# Patient Record
Sex: Male | Born: 1980 | Race: White | Hispanic: No | Marital: Single | Smoking: Never smoker
Health system: Southern US, Community
[De-identification: ages and names within clinical notes are randomized; demographics above are authoritative.]

## PROBLEM LIST (undated history)

## (undated) ENCOUNTER — Ambulatory Visit: Admission: EM | Payer: Self-pay | Source: Home / Self Care

## (undated) ENCOUNTER — Emergency Department (HOSPITAL_COMMUNITY): Admission: EM | Payer: PRIVATE HEALTH INSURANCE | Source: Home / Self Care

## (undated) DIAGNOSIS — J45909 Unspecified asthma, uncomplicated: Secondary | ICD-10-CM

## (undated) DIAGNOSIS — N289 Disorder of kidney and ureter, unspecified: Secondary | ICD-10-CM

## (undated) HISTORY — PX: CYSTOSCOPY W/ URETEROSCOPY W/ LITHOTRIPSY: SUR380

---

## 2005-04-25 ENCOUNTER — Emergency Department: Payer: Self-pay | Admitting: Emergency Medicine

## 2005-08-29 ENCOUNTER — Emergency Department: Payer: Self-pay | Admitting: Emergency Medicine

## 2008-02-29 ENCOUNTER — Emergency Department: Payer: Self-pay | Admitting: Internal Medicine

## 2008-04-18 ENCOUNTER — Ambulatory Visit: Payer: Self-pay | Admitting: Urology

## 2008-04-20 ENCOUNTER — Ambulatory Visit: Payer: Self-pay | Admitting: Urology

## 2008-04-26 ENCOUNTER — Ambulatory Visit: Payer: Self-pay | Admitting: Urology

## 2008-05-10 ENCOUNTER — Ambulatory Visit: Payer: Self-pay | Admitting: Urology

## 2010-05-14 IMAGING — CR DG ABDOMEN 1V
1 series · 1 of 1 positions shown · non-contrast
Comparison: none

REASON FOR EXAM: nephrolithiasis  pt need films
COMMENTS:

[view not recorded]
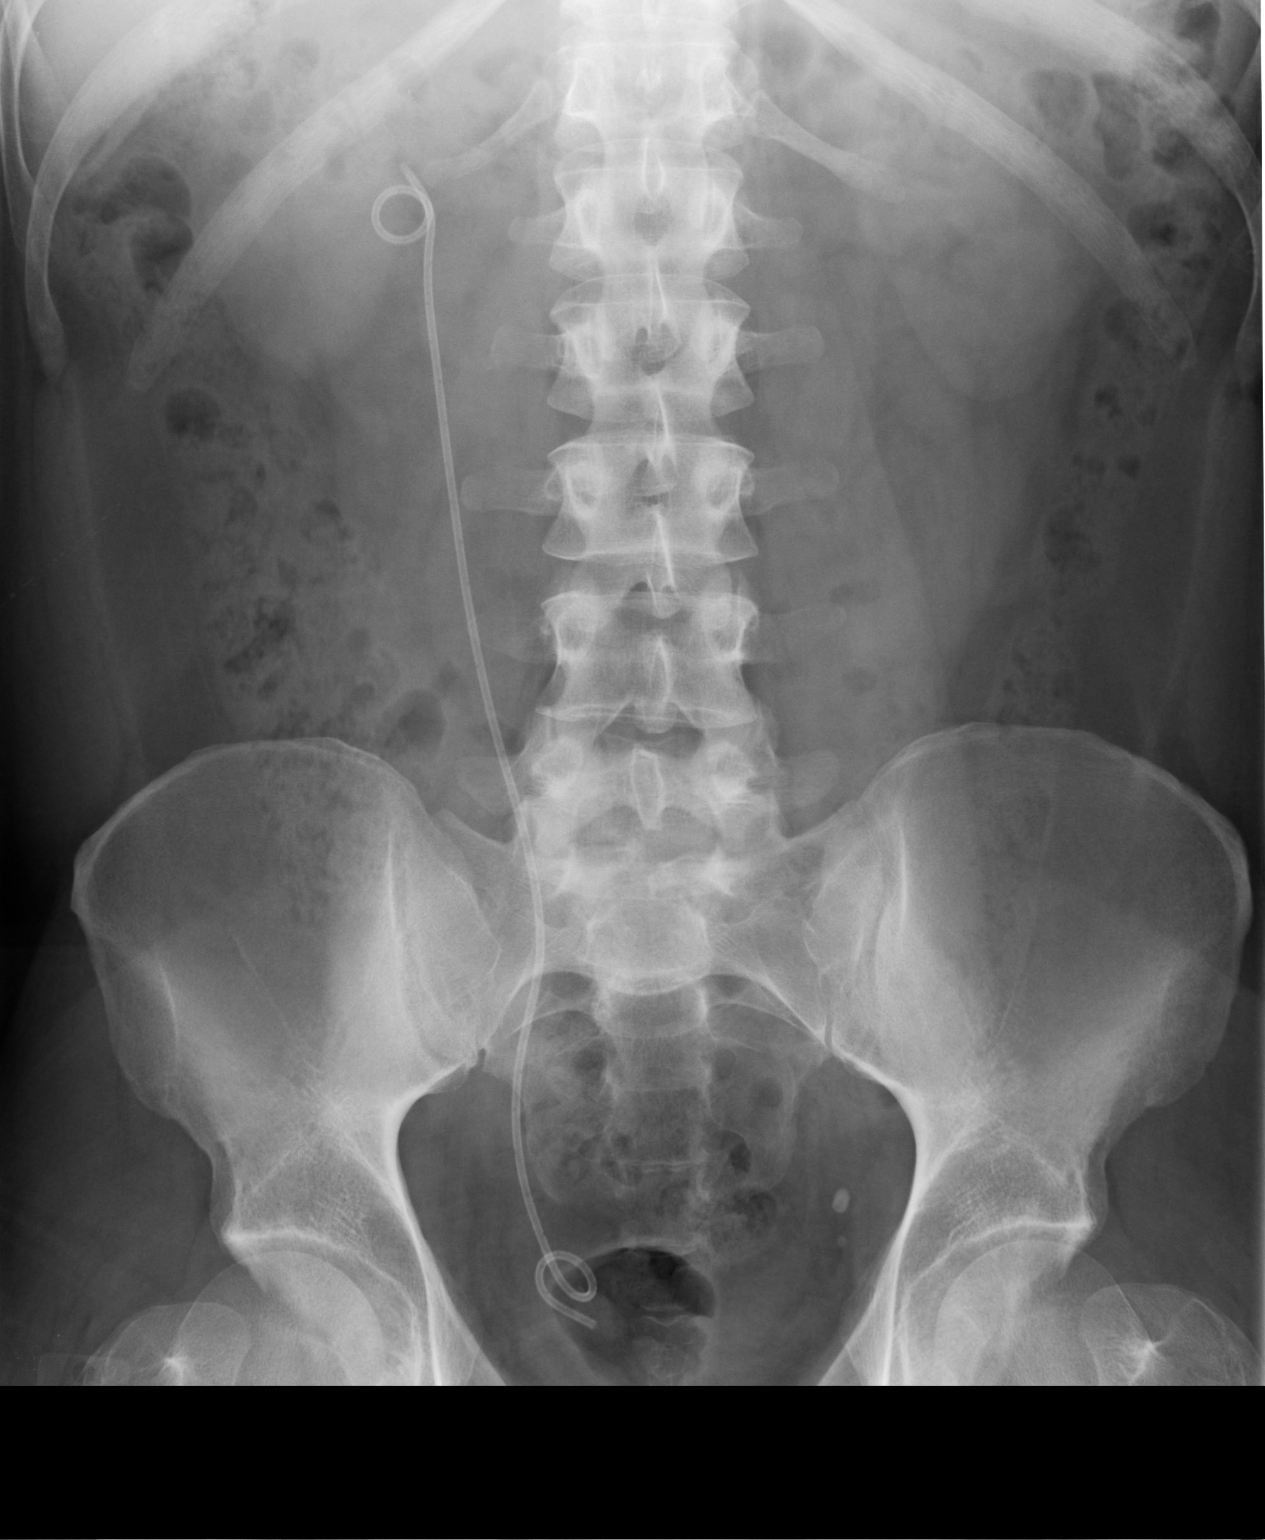

[1 of 1 positions shown; findings below may reference images not displayed]

PROCEDURE:     DXR - DXR KIDNEY URETER BLADDER  - April 26, 2008 [DATE]

RESULT:     Comparison is made to the prior exam of 04/18/2008. A RIGHT
ureteral stent is now present. The previously noted calcification low in the
RIGHT pelvis is no longer seen and apparently represented a stone that has
passed in the bladder. No definite ureteral stones are identified along the
course of the stent. No renal calcifications are identified.
IMPRESSION: 1.     Please see above.

## 2013-01-20 ENCOUNTER — Emergency Department (HOSPITAL_COMMUNITY)
Admission: EM | Admit: 2013-01-20 | Discharge: 2013-01-20 | Disposition: A | Discharge status: Home / Self Care | Payer: BC Managed Care – PPO | Attending: Emergency Medicine | Admitting: Emergency Medicine

## 2013-01-20 ENCOUNTER — Encounter (HOSPITAL_COMMUNITY): Payer: Self-pay | Admitting: *Deleted

## 2013-01-20 DIAGNOSIS — Y9302 Activity, running: Secondary | ICD-10-CM | POA: Insufficient documentation

## 2013-01-20 DIAGNOSIS — Y9289 Other specified places as the place of occurrence of the external cause: Secondary | ICD-10-CM | POA: Insufficient documentation

## 2013-01-20 DIAGNOSIS — Z87442 Personal history of urinary calculi: Secondary | ICD-10-CM | POA: Insufficient documentation

## 2013-01-20 DIAGNOSIS — S838X9A Sprain of other specified parts of unspecified knee, initial encounter: Secondary | ICD-10-CM | POA: Insufficient documentation

## 2013-01-20 DIAGNOSIS — Z79899 Other long term (current) drug therapy: Secondary | ICD-10-CM | POA: Insufficient documentation

## 2013-01-20 DIAGNOSIS — J45901 Unspecified asthma with (acute) exacerbation: Secondary | ICD-10-CM | POA: Insufficient documentation

## 2013-01-20 DIAGNOSIS — W2209XA Striking against other stationary object, initial encounter: Secondary | ICD-10-CM | POA: Insufficient documentation

## 2013-01-20 DIAGNOSIS — S86111A Strain of other muscle(s) and tendon(s) of posterior muscle group at lower leg level, right leg, initial encounter: Secondary | ICD-10-CM

## 2013-01-20 HISTORY — DX: Unspecified asthma, uncomplicated: J45.909

## 2013-01-20 HISTORY — DX: Disorder of kidney and ureter, unspecified: N28.9

## 2013-01-20 MED ORDER — CELECOXIB 100 MG PO CAPS: 100.0000 mg | ORAL_CAPSULE | Freq: Two times a day (BID) | ORAL | Status: DC

## 2013-01-20 NOTE — ED Notes (Signed)
While running 1st 5K obstacle course, went over a-frame wooden ramp, throwing R leg over the top, hitting fibula area on crossbeam.  Pain in lower extremity on mid-fibula region began that night.  Has now progressed to include mid-tibial region.  Constant ache, with tenderness to palpation from mid-tibia laterally to posterior calf.  States pain in more severe with inversion of foot while standing or walking and with plantar flexion, which radiates pain from ankle to lateral knee.  No pain on foot eversion or dorsi flexion.  No bruising or swelling noted.  Muscle soft to palpation.  No asymmetry of extremities noted.  Has taken ibuprofen once for pain, but has not needed subsequent doses.

## 2013-01-20 NOTE — Progress Notes (Signed)
ED/CM noted patient did not have health insurance and/or PCP listed in the computer.  Patient was given the [place] resource handout with information on the clinics, food pantries, and the handout for new health insurance sign-up.  Patient expressed appreciation for this. 

## 2013-01-20 NOTE — ED Notes (Signed)
Patient with no complaints at this time. Respirations even and unlabored. Skin warm/dry. Discharge instructions reviewed with patient at this time. Patient given opportunity to voice concerns/ask questions. Patient discharged at this time and left Emergency Department with steady gait.   

## 2013-01-20 NOTE — Care Management ED Note (Signed)
       CARE MANAGEMENT ED NOTE 01/20/2013  Patient:  Tyrone Ferguson,Tyrone Ferguson   Account Number:  [id number]  Date Initiated:  01/20/2013  Documentation initiated by:  Anibal Henderson  Subjective/Objective Assessment:     Subjective/Objective Assessment Detail:   ED/CM noted patient did not have health insurance and/or PCP listed in the computer.  Patient was given the [facility] with information on the clinics, food pantries, and the handout for new health insurance sign-up.  Patient expressed appreciation for this.     Action/Plan:   Action/Plan Detail:   Anticipated DC Date:  01/20/2013     Status Recommendation to Physician:   Result of Recommendation:        Choice offered to / List presented to:            Status of service:    ED Comments:   ED Comments Detail:

## 2013-01-20 NOTE — ED Provider Notes (Signed)
Medical screening examination/treatment/procedure(s) were performed by non-physician practitioner and as supervising physician I was immediately available for consultation/collaboration.   Candyce Churn, MD 01/20/13 [phone]

## 2013-01-20 NOTE — ED Provider Notes (Signed)
CSN: [id number]     Arrival date & time 01/20/13  0912 History   First MD Initiated Contact with Patient 01/20/13 0930     Chief Complaint  Patient presents with  . Leg Injury   (Consider location/radiation/quality/duration/timing/severity/associated sxs/prior Treatment) HPI Comments: Pt was running in a 5K obstacle course Sat. 9/13 and injured the right upper leg early in the run. By the time he finished the course he states he was limping. He has tried tylenol but has not rested the knee and leg. No hx of previous operations or procedures on the right lower extremity. No other injury.  The history is provided by the patient.    Past Medical History  Diagnosis Date  . Renal disorder     kidney stones  . Asthma    Past Surgical History  Procedure Laterality Date  . Cystoscopy w/ ureteroscopy w/ lithotripsy     No family history on file. History  Substance Use Topics  . Smoking status: Never Smoker   . Smokeless tobacco: Never Used  . Alcohol Use: No    Review of Systems  Constitutional: Negative for activity change.       All ROS Neg except as noted in HPI  HENT: Negative for nosebleeds and neck pain.   Eyes: Negative for photophobia and discharge.  Respiratory: Positive for wheezing. Negative for cough and shortness of breath.   Cardiovascular: Negative for chest pain and palpitations.  Gastrointestinal: Negative for abdominal pain and blood in stool.  Genitourinary: Negative for dysuria, frequency and hematuria.  Musculoskeletal: Negative for back pain and arthralgias.  Skin: Negative.   Neurological: Negative for dizziness, seizures and speech difficulty.  Psychiatric/Behavioral: Negative for hallucinations and confusion.    Allergies  Aspirin and Morphine and related  Home Medications   Current Outpatient Rx  Name  Route  Sig  Dispense  Refill  . acetaminophen (TYLENOL) 500 MG tablet   Oral   Take 1,000 mg by mouth every 6 (six) hours as needed for pain.        Marland Kitchen albuterol (PROVENTIL HFA;VENTOLIN HFA) 108 (90 BASE) MCG/ACT inhaler   Inhalation   Inhale 2 puffs into the lungs every 6 (six) hours as needed for wheezing.          BP 111/78  Pulse 71  Temp(Src) 98.1 F (36.7 C) (Oral)  Resp 16  Ht 5\' 7"  (1.702 m)  Wt 185 lb (83.915 kg)  BMI 28.97 kg/m2  SpO2 97% Physical Exam  Nursing note and vitals reviewed. Constitutional: He is oriented to person, place, and time. He appears well-developed and well-nourished.  Non-toxic appearance.  HENT:  Head: Normocephalic.  Right Ear: Tympanic membrane and external ear normal.  Left Ear: Tympanic membrane and external ear normal.  Eyes: EOM and lids are normal. Pupils are equal, round, and reactive to light.  Neck: Normal range of motion. Neck supple. Carotid bruit is not present.  Cardiovascular: Normal rate, regular rhythm, normal heart sounds, intact distal pulses and normal pulses.   Pulmonary/Chest: Breath sounds normal. No respiratory distress.  Abdominal: Soft. Bowel sounds are normal. There is no tenderness. There is no guarding.  Musculoskeletal: Normal range of motion.       Legs: FROM of the right hip. No effusion or deformity or laxity of the right knee. No bruise of the right calf. No hematoma of the right calf or lower let. Right and left lower leg are the same size and without redness.  Lymphadenopathy:  Head (right side): No submandibular adenopathy present.       Head (left side): No submandibular adenopathy present.    He has no cervical adenopathy.  Neurological: He is alert and oriented to person, place, and time. He has normal strength. No cranial nerve deficit or sensory deficit.  Skin: Skin is warm and dry.  Psychiatric: He has a normal mood and affect. His speech is normal.    ED Course  Procedures (including critical care time) Labs Review Labs Reviewed - No data to display Imaging Review No results found.  MDM  No diagnosis found. *I have reviewed  nursing notes, vital signs, and all appropriate lab and imaging results for this patient.**  Suspect gastroc strain during obstacle course. Pt will be fitted with knee immobilizer and Rx for mobic ordered. Pt to see Dr Romeo Apple for orthopedic evaluation if not improving.  Kathie Dike, PA-C 01/20/13 1100

## 2013-01-20 NOTE — ED Notes (Signed)
Pt states he was running a 5K obstacle course and hit his right lower leg on an obstacle. Pain to right lower leg since.

## 2021-06-23 ENCOUNTER — Other Ambulatory Visit: Payer: Self-pay

## 2021-06-23 ENCOUNTER — Emergency Department (HOSPITAL_COMMUNITY): Payer: 59

## 2021-06-23 ENCOUNTER — Emergency Department (HOSPITAL_COMMUNITY)
Admission: EM | Admit: 2021-06-23 | Discharge: 2021-06-23 | Disposition: A | Discharge status: Home / Self Care | Payer: 59 | Attending: Emergency Medicine | Admitting: Emergency Medicine

## 2021-06-23 ENCOUNTER — Encounter (HOSPITAL_COMMUNITY): Payer: Self-pay

## 2021-06-23 DIAGNOSIS — N201 Calculus of ureter: Secondary | ICD-10-CM | POA: Diagnosis not present

## 2021-06-23 DIAGNOSIS — R109 Unspecified abdominal pain: Secondary | ICD-10-CM | POA: Diagnosis present

## 2021-06-23 LAB — CBC
HCT: 41.2 % (ref 39.0–52.0)
Hemoglobin: 13.6 g/dL (ref 13.0–17.0)
MCH: 31.6 pg (ref 26.0–34.0)
MCHC: 33 g/dL (ref 30.0–36.0)
MCV: 95.8 fL (ref 80.0–100.0)
Platelets: 364 10*3/uL (ref 150–400)
RBC: 4.3 MIL/uL (ref 4.22–5.81)
RDW: 13.2 % (ref 11.5–15.5)
WBC: 12.3 10*3/uL — ABNORMAL HIGH (ref 4.0–10.5)
nRBC: 0 % (ref 0.0–0.2)

## 2021-06-23 LAB — URINALYSIS, ROUTINE W REFLEX MICROSCOPIC
Bacteria, UA: NONE SEEN
Bilirubin Urine: NEGATIVE
Glucose, UA: NEGATIVE mg/dL
Ketones, ur: NEGATIVE mg/dL
Leukocytes,Ua: NEGATIVE
Nitrite: POSITIVE — AB
Protein, ur: 30 mg/dL — AB
RBC / HPF: >50 RBC/hpf — ABNORMAL HIGH (ref 0–5)
Specific Gravity, Urine: 1.023 (ref 1.005–1.030)
pH: 7 (ref 5.0–8.0)

## 2021-06-23 LAB — COMPREHENSIVE METABOLIC PANEL
ALT: 19 U/L (ref 0–44)
AST: 19 U/L (ref 15–41)
Albumin: 4 g/dL (ref 3.5–5.0)
Alkaline Phosphatase: 65 U/L (ref 38–126)
Anion gap: 7 (ref 5–15)
BUN: 17 mg/dL (ref 6–20)
CO2: 28 mmol/L (ref 22–32)
Calcium: 9.2 mg/dL (ref 8.9–10.3)
Chloride: 102 mmol/L (ref 98–111)
Creatinine, Ser: 1.35 mg/dL — ABNORMAL HIGH (ref 0.61–1.24)
GFR, Estimated: >60 mL/min (ref >60)
Glucose, Bld: 112 mg/dL — ABNORMAL HIGH (ref 70–99)
Potassium: 3.9 mmol/L (ref 3.5–5.1)
Sodium: 137 mmol/L (ref 135–145)
Total Bilirubin: 0.4 mg/dL (ref 0.3–1.2)
Total Protein: 7.4 g/dL (ref 6.5–8.1)

## 2021-06-23 LAB — LIPASE, BLOOD: Lipase: 32 U/L (ref 11–51)

## 2021-06-23 MED ORDER — HYDROMORPHONE HCL 1 MG/ML IJ SOLN
0.5000 mg | Freq: Once | INTRAMUSCULAR | Status: AC
  Administered 2021-06-23: 0.5 mg via INTRAVENOUS
  Filled 2021-06-23: qty 1

## 2021-06-23 MED ORDER — OXYCODONE-ACETAMINOPHEN 5-325 MG PO TABS: 1.0000 | ORAL_TABLET | ORAL | 0 refills | Status: DC | PRN

## 2021-06-23 MED ORDER — TAMSULOSIN HCL 0.4 MG PO CAPS
0.4000 mg | ORAL_CAPSULE | Freq: Once | ORAL | Status: AC
  Administered 2021-06-23: 0.4 mg via ORAL
  Filled 2021-06-23: qty 1

## 2021-06-23 MED ORDER — OXYCODONE-ACETAMINOPHEN 5-325 MG PO TABS: 1.0000 | ORAL_TABLET | Freq: Four times a day (QID) | ORAL | 0 refills | Status: DC | PRN

## 2021-06-23 MED ORDER — FENTANYL CITRATE PF 50 MCG/ML IJ SOSY
50.0000 ug | PREFILLED_SYRINGE | Freq: Once | INTRAMUSCULAR | Status: AC
  Administered 2021-06-23: 50 ug via INTRAVENOUS
  Filled 2021-06-23: qty 1

## 2021-06-23 MED ORDER — SODIUM CHLORIDE 0.9 % IV BOLUS
1000.0000 mL | Freq: Once | INTRAVENOUS | Status: AC
Start: 2021-06-23 — End: 2021-06-23
  Administered 2021-06-23: 1000 mL via INTRAVENOUS

## 2021-06-23 MED ORDER — KETOROLAC TROMETHAMINE 30 MG/ML IJ SOLN
30.0000 mg | Freq: Once | INTRAMUSCULAR | Status: AC
  Administered 2021-06-23: 30 mg via INTRAVENOUS
  Filled 2021-06-23: qty 1

## 2021-06-23 MED ORDER — CEPHALEXIN 500 MG PO CAPS: 500.0000 mg | ORAL_CAPSULE | Freq: Four times a day (QID) | ORAL | 0 refills | Status: DC

## 2021-06-23 MED ORDER — TAMSULOSIN HCL 0.4 MG PO CAPS: 0.4000 mg | ORAL_CAPSULE | Freq: Every day | ORAL | 0 refills | Status: DC

## 2021-06-23 MED ORDER — ONDANSETRON HCL 4 MG/2ML IJ SOLN
4.0000 mg | Freq: Once | INTRAMUSCULAR | Status: AC
  Administered 2021-06-23: 4 mg via INTRAVENOUS
  Filled 2021-06-23: qty 2

## 2021-06-23 MED ORDER — CEPHALEXIN 500 MG PO CAPS
500.0000 mg | ORAL_CAPSULE | Freq: Once | ORAL | Status: AC
  Administered 2021-06-23: 500 mg via ORAL
  Filled 2021-06-23: qty 1

## 2021-06-23 NOTE — Discharge Instructions (Addendum)
Make sure you are drinking plenty of fluids.  Take your next dose of Keflex tomorrow morning and your next dose of the Flomax tomorrow evening.  Do not drive within 4 hours of taking oxycodone as this medication will make you drowsy.  Plan to call Dr. Kennyth Lose urology for close follow-up with them for this stone.  Return here immediately if you develop fever, uncontrolled vomiting or pain that is not being adequately controlled with the pain medication prescribed.

## 2021-06-23 NOTE — ED Triage Notes (Signed)
Patient complaining of left flank pain with radiation to left abdomen. Pain worse this am. Patient has been treating UTI symptoms with OTC meds for the past week.

## 2021-06-23 NOTE — ED Provider Notes (Signed)
[facility] EMERGENCY DEPARTMENT Provider Note   CSN: [id number] Arrival date & time: 06/23/21  1637     History  Chief Complaint  Patient presents with   Flank Pain    Tyrone Ferguson is a 41 y.o. male with a history of kidney stones presenting with a 1 week history of left-sided flank pain which radiates into his left abdomen.  He reports intermittent symptoms since this started, reports was traveling with his job and was unable to get definitive treatment but was able to tolerate it by use of OTC Azo.  He denies fevers or chills.  He has had escalating pain today which has been constant and increasingly severe associated with nausea and vomiting which also started when the pain became severe.  He reports his urine has been dark but also has been using Azo.  The history is provided by the patient.      Home Medications Prior to Admission medications   Medication Sig Start Date End Date Taking? Authorizing Provider  cephALEXin (KEFLEX) 500 MG capsule Take 1 capsule (500 mg total) by mouth 4 (four) times daily. 06/23/21  Yes Keedan Sample, Almyra Free, PA-C  oxyCODONE-acetaminophen (PERCOCET/ROXICET) 5-325 MG tablet Take 1 tablet by mouth every 4 (four) hours as needed. 06/23/21  Yes Brittinee Risk, Almyra Free, PA-C  oxyCODONE-acetaminophen (PERCOCET/ROXICET) 5-325 MG tablet Take 1 tablet by mouth every 6 (six) hours as needed for severe pain. 06/23/21  Yes Orlandus Borowski, Almyra Free, PA-C  tamsulosin (FLOMAX) 0.4 MG CAPS capsule Take 1 capsule (0.4 mg total) by mouth daily after supper. 06/23/21  Yes Cherryl Babin, Almyra Free, PA-C  acetaminophen (TYLENOL) 500 MG tablet Take 1,000 mg by mouth every 6 (six) hours as needed for pain.    [provider]  albuterol (PROVENTIL HFA;VENTOLIN HFA) 108 (90 BASE) MCG/ACT inhaler Inhale 2 puffs into the lungs every 6 (six) hours as needed for wheezing.    [provider]  celecoxib (CELEBREX) 100 MG capsule Take 1 capsule (100 mg total) by mouth 2 (two) times daily. 01/20/13   Lily Kocher,  PA-C      Allergies    Aspirin and Morphine and related    Review of Systems   Review of Systems  Constitutional:  Negative for fever.  HENT:  Negative for congestion and sore throat.   Eyes: Negative.   Respiratory:  Negative for chest tightness and shortness of breath.   Cardiovascular:  Negative for chest pain.  Gastrointestinal:  Positive for nausea and vomiting. Negative for abdominal pain.  Genitourinary:  Positive for decreased urine volume, flank pain and frequency. Negative for dysuria.  Musculoskeletal:  Negative for arthralgias, joint swelling and neck pain.  Skin: Negative.  Negative for rash and wound.  Neurological:  Negative for dizziness, weakness, light-headedness, numbness and headaches.  Psychiatric/Behavioral: Negative.     Physical Exam Updated Vital Signs BP 108/70    Pulse 60    Temp 98 F (36.7 C)    Resp 17    Ht 5\' 7"  (1.702 m)    Wt 88.5 kg    SpO2 (!) 87%    BMI 30.54 kg/m  Physical Exam Vitals and nursing note reviewed.  Constitutional:      Appearance: He is well-developed.  HENT:     Head: Normocephalic and atraumatic.  Eyes:     Conjunctiva/sclera: Conjunctivae normal.  Cardiovascular:     Rate and Rhythm: Normal rate and regular rhythm.     Heart sounds: Normal heart sounds.  Pulmonary:     Effort: Pulmonary  effort is normal.     Breath sounds: Normal breath sounds. No wheezing.  Abdominal:     General: Bowel sounds are normal.     Palpations: Abdomen is soft.     Tenderness: There is no abdominal tenderness. There is left CVA tenderness. There is no guarding or rebound.  Musculoskeletal:        General: Normal range of motion.     Cervical back: Normal range of motion.  Skin:    General: Skin is warm and dry.  Neurological:     Mental Status: He is alert.    ED Results / Procedures / Treatments   Labs (all labs ordered are listed, but only abnormal results are displayed) Labs Reviewed  COMPREHENSIVE METABOLIC PANEL - Abnormal;  Notable for the following components:      Result Value   Glucose, Bld 112 (*)    Creatinine, Ser 1.35 (*)    All other components within normal limits  CBC - Abnormal; Notable for the following components:   WBC 12.3 (*)    All other components within normal limits  URINALYSIS, ROUTINE W REFLEX MICROSCOPIC - Abnormal; Notable for the following components:   Color, Urine AMBER (*)    APPearance CLOUDY (*)    Hgb urine dipstick MODERATE (*)    Protein, ur 30 (*)    Nitrite POSITIVE (*)    RBC / HPF >50 (*)    All other components within normal limits  LIPASE, BLOOD    EKG None  Radiology CT Renal Stone Study  Result Date: 06/23/2021 CLINICAL DATA:  Left flank pain. EXAM: CT ABDOMEN AND PELVIS WITHOUT CONTRAST TECHNIQUE: Multidetector CT imaging of the abdomen and pelvis was performed following the standard protocol without IV contrast. RADIATION DOSE REDUCTION: This exam was performed according to the departmental dose-optimization program which includes automated exposure control, adjustment of the mA and/or kV according to patient size and/or use of iterative reconstruction technique. COMPARISON:  Remote abdominal CT 08/29/2005 FINDINGS: Lower chest: 3 mm right lower lobe subpleural nodule, series 4, image 4. No consolidation or pleural effusion. The heart is normal in size. Hepatobiliary: No focal liver abnormality is seen. No gallstones, gallbladder wall thickening, or biliary dilatation. Pancreas: No ductal dilatation or inflammation. Spleen: Normal in size without focal abnormality. Adrenals/Urinary Tract: Normal adrenal glands. Obstructing elongated stone versus 2 adjacent stones at the left ureterovesicular junction measuring 8 mm with moderate proximal hydroureteronephrosis. Mild left perinephric edema. There are at least 3 punctate additional nonobstructing stones in the mid lower left kidney. Probable cortical cyst in the posterior mid left kidney. There is no right hydronephrosis.  Two right renal calculi, largest in the lower pole measuring 4 mm. Urinary bladder is only minimally distended. No bladder wall thickening. Stomach/Bowel: Small hiatal hernia. The stomach is otherwise unremarkable. No bowel obstruction or inflammation. Normal appendix. Small to moderate colonic stool burden. Occasional colonic diverticula in the distal descending and sigmoid colon. No diverticulitis. Vascular/Lymphatic: Normal caliber abdominal aorta with mild atherosclerosis. No enlarged lymph nodes in the abdomen or pelvis. Reproductive: Prostate is unremarkable. Other: No ascites or free air. Small fat containing umbilical hernia. Musculoskeletal: There are no acute or suspicious osseous abnormalities. IMPRESSION: 1. Obstructing elongated stone versus 2 adjacent stones at the left ureterovesicular junction measuring 8 mm with moderate proximal hydroureteronephrosis. 2. Additional nonobstructing stones in both kidneys. 3. Minimal colonic diverticulosis without diverticulitis. 4. Small hiatal hernia. 5. A 3 mm right lower lobe subpleural nodule. No follow-up needed if  patient is low-risk. Non-contrast chest CT can be considered in 12 months if patient is high-risk. This recommendation follows the consensus statement: Guidelines for Management of Incidental Pulmonary Nodules Detected on CT Images: From the Fleischner Society 2017; Radiology 2017; 284:228-243. 6. Mild aortic and iliac atherosclerosis. Aortic Atherosclerosis (ICD10-I70.0). Electronically Signed   By: Keith Rake M.D.   On: 06/23/2021 18:18    Procedures Procedures    Medications Ordered in ED Medications  ondansetron (ZOFRAN) injection 4 mg (4 mg Intravenous Given 06/23/21 1749)  fentaNYL (SUBLIMAZE) injection 50 mcg (50 mcg Intravenous Given 06/23/21 1749)  sodium chloride 0.9 % bolus 1,000 mL (0 mLs Intravenous Stopped 06/23/21 2153)  ketorolac (TORADOL) 30 MG/ML injection 30 mg (30 mg Intravenous Given 06/23/21 2054)  HYDROmorphone  (DILAUDID) injection 0.5 mg (0.5 mg Intravenous Given 06/23/21 2055)  ondansetron (ZOFRAN) injection 4 mg (4 mg Intravenous Given 06/23/21 2053)  cephALEXin (KEFLEX) capsule 500 mg (500 mg Oral Given 06/23/21 2225)  tamsulosin (FLOMAX) capsule 0.4 mg (0.4 mg Oral Given 06/23/21 2225)    ED Course/ Medical Decision Making/ A&P                           Medical Decision Making Pt with left flank pain, n/v, afebrile, h/o kidney stones with todays presentation similar to prior kidney stone.  He was given IVF here along with zofran and fentanyl which offered relief transiently, After imaging obtained,  added small dose of dilaudid and also toradol which resolved his pain sx.    Amount and/or Complexity of Data Reviewed Labs: ordered.    Details: elevated wbc count at 12.3, but afebrile.  creatinine 1.35, no old creatinine to compare, urine negative for infection, + hgb. Radiology: ordered.    Details: Imaging reviewed and discussed with pt.  He is low risk for lung ca - no smoking hx. Discussion of management or test interpretation with external provider(s): Discussed pts sx, lab results and Ct imaging results with Dr. Junious Silk.  He would like pt to have close outpt f/u with him, would recommend keflex , flomax and pain med in the interim.  Meds started here.   Urine strainer also provided pt.  Return precautions discussed.  Pt understands and agreeable with plan.   Risk Prescription drug management. Decision regarding hospitalization.           Final Clinical Impression(s) / ED Diagnoses Final diagnoses:  Ureterolithiasis    Rx / DC Orders ED Discharge Orders          Ordered    cephALEXin (KEFLEX) 500 MG capsule  4 times daily        06/23/21 2204    tamsulosin (FLOMAX) 0.4 MG CAPS capsule  Daily after supper        06/23/21 2204    oxyCODONE-acetaminophen (PERCOCET/ROXICET) 5-325 MG tablet  Every 4 hours PRN        06/23/21 2204    oxyCODONE-acetaminophen (PERCOCET/ROXICET)  5-325 MG tablet  Every 6 hours PRN        06/23/21 2206              Evalee Jefferson, Hershal Coria 06/24/21 1602    Milton Ferguson, MD 06/25/21 1550

## 2021-06-24 MED FILL — Oxycodone w/ Acetaminophen Tab 5-325 MG: ORAL | Qty: 6 | Status: AC

## 2021-06-26 ENCOUNTER — Other Ambulatory Visit: Payer: Self-pay

## 2021-06-26 DIAGNOSIS — N200 Calculus of kidney: Secondary | ICD-10-CM

## 2021-07-01 ENCOUNTER — Ambulatory Visit (INDEPENDENT_AMBULATORY_CARE_PROVIDER_SITE_OTHER): Payer: 59 | Admitting: Urology

## 2021-07-01 ENCOUNTER — Encounter: Payer: Self-pay | Admitting: Urology

## 2021-07-01 ENCOUNTER — Ambulatory Visit (HOSPITAL_COMMUNITY)
Admission: RE | Admit: 2021-07-01 | Discharge: 2021-07-01 | Disposition: A | Discharge status: Home / Self Care | Payer: 59 | Source: Ambulatory Visit | Attending: Urology | Admitting: Urology

## 2021-07-01 ENCOUNTER — Other Ambulatory Visit: Payer: Self-pay

## 2021-07-01 VITALS — BP 106/69 | HR 81 | Ht 67.0 in | Wt 195.0 lb

## 2021-07-01 DIAGNOSIS — N200 Calculus of kidney: Secondary | ICD-10-CM | POA: Insufficient documentation

## 2021-07-01 LAB — URINALYSIS, ROUTINE W REFLEX MICROSCOPIC
Bilirubin, UA: NEGATIVE
Glucose, UA: NEGATIVE
Ketones, UA: NEGATIVE
Leukocytes,UA: NEGATIVE
Nitrite, UA: NEGATIVE
Protein,UA: NEGATIVE
Specific Gravity, UA: 1.025 (ref 1.005–1.030)
Urobilinogen, Ur: 0.2 mg/dL (ref 0.2–1.0)
pH, UA: 6.5 (ref 5.0–7.5)

## 2021-07-01 LAB — MICROSCOPIC EXAMINATION
Renal Epithel, UA: NONE SEEN /hpf
WBC, UA: NONE SEEN /hpf (ref 0–5)

## 2021-07-01 NOTE — Progress Notes (Signed)
07/01/2021 12:11 PM   Tyrone Ferguson Jul 16, 1980 [id number]  Referring provider: No referring provider defined for this encounter.  Nephrolithiasis   HPI: [name] is a 41yo here for evaluation of nephrolithiasis. He was seen in the ER 2/19 with severe left flank pain and was diagnosed with left ureteral calculi measuring 59mm. He passed his calculi 2 days ago and brought them with him today. His left flank pain has resolved. He has had numerous stone events in the past 15 years. He was previously a patient of [facility]. He has not had a metabolic evaluation for his hx of nephrolithiasis. He denies any other associated symptoms. KUB from today shows bilateral renal calculi. He denies any flank pain currently.    PMH: Past Medical History:  Diagnosis Date   Asthma    Renal disorder    kidney stones    Surgical History: Past Surgical History:  Procedure Laterality Date   CYSTOSCOPY W/ URETEROSCOPY W/ LITHOTRIPSY      Home Medications:  Allergies as of 07/01/2021       Reactions   Aspirin Nausea And Vomiting   Morphine And Related Nausea And Vomiting        Medication List        Accurate as of July 01, 2021 12:11 PM. If you have any questions, ask your nurse or doctor.          acetaminophen 500 MG tablet Commonly known as: TYLENOL Take 1,000 mg by mouth every 6 (six) hours as needed for pain.   albuterol 108 (90 Base) MCG/ACT inhaler Commonly known as: VENTOLIN HFA Inhale 2 puffs into the lungs every 6 (six) hours as needed for wheezing.   celecoxib 100 MG capsule Commonly known as: CeleBREX Take 1 capsule (100 mg total) by mouth 2 (two) times daily.   cephALEXin 500 MG capsule Commonly known as: KEFLEX Take 1 capsule (500 mg total) by mouth 4 (four) times daily.   oxyCODONE-acetaminophen 5-325 MG tablet Commonly known as: PERCOCET/ROXICET Take 1 tablet by mouth every 4 (four) hours as needed.   oxyCODONE-acetaminophen 5-325 MG  tablet Commonly known as: PERCOCET/ROXICET Take 1 tablet by mouth every 6 (six) hours as needed for severe pain.   tamsulosin 0.4 MG Caps capsule Commonly known as: Flomax Take 1 capsule (0.4 mg total) by mouth daily after supper.        Allergies:  Allergies  Allergen Reactions   Aspirin Nausea And Vomiting   Morphine And Related Nausea And Vomiting    Family History: History reviewed. No pertinent family history.  Social History:  reports that he has never smoked. He has never used smokeless tobacco. He reports that he does not drink alcohol. No history on file for drug use.  ROS: All other review of systems were reviewed and are negative except what is noted above in HPI  Physical Exam: BP 106/69    Pulse 81    Ht 5\' 7"  (1.702 m)    Wt 195 lb (88.5 kg)    BMI 30.54 kg/m   Constitutional:  Alert and oriented, No acute distress. HEENT: [place] AT, moist mucus membranes.  Trachea midline, no masses. Cardiovascular: No clubbing, cyanosis, or edema. Respiratory: Normal respiratory effort, no increased work of breathing. GI: Abdomen is soft, nontender, nondistended, no abdominal masses GU: No CVA tenderness.  Lymph: No cervical or inguinal lymphadenopathy. Skin: No rashes, bruises or suspicious lesions. Neurologic: Grossly intact, no focal deficits, moving all 4 extremities. Psychiatric: Normal mood and  affect.  Laboratory Data: Lab Results  Component Value Date   WBC 12.3 (H) 06/23/2021   HGB 13.6 06/23/2021   HCT 41.2 06/23/2021   MCV 95.8 06/23/2021   PLT 364 06/23/2021    Lab Results  Component Value Date   CREATININE 1.35 (H) 06/23/2021    No results found for: PSA  No results found for: TESTOSTERONE  No results found for: HGBA1C  Urinalysis    Component Value Date/Time   COLORURINE AMBER (A) 06/23/2021 2007   APPEARANCEUR CLOUDY (A) 06/23/2021 2007   LABSPEC 1.023 06/23/2021 2007   PHURINE 7.0 06/23/2021 2007   GLUCOSEU NEGATIVE 06/23/2021 2007    HGBUR MODERATE (A) 06/23/2021 2007   [place] NEGATIVE 06/23/2021 2007   [place] NEGATIVE 06/23/2021 2007   PROTEINUR 30 (A) 06/23/2021 2007   NITRITE POSITIVE (A) 06/23/2021 2007   LEUKOCYTESUR NEGATIVE 06/23/2021 2007    Lab Results  Component Value Date   BACTERIA NONE SEEN 06/23/2021    Pertinent Imaging: CT stone study 2/19 and KUb today: Images reviewed and discussed with the patient No results found for this or any previous visit.  No results found for this or any previous visit.  No results found for this or any previous visit.  No results found for this or any previous visit.  No results found for this or any previous visit.  No results found for this or any previous visit.  No results found for this or any previous visit.  Results for orders placed during the hospital encounter of 06/23/21  CT Renal Stone Study  Narrative CLINICAL DATA:  Left flank pain.  EXAM: CT ABDOMEN AND PELVIS WITHOUT CONTRAST  TECHNIQUE: Multidetector CT imaging of the abdomen and pelvis was performed following the standard protocol without IV contrast.  RADIATION DOSE REDUCTION: This exam was performed according to the departmental dose-optimization program which includes automated exposure control, adjustment of the mA and/or kV according to patient size and/or use of iterative reconstruction technique.  COMPARISON:  Remote abdominal CT 08/29/2005  FINDINGS: Lower chest: 3 mm right lower lobe subpleural nodule, series 4, image 4. No consolidation or pleural effusion. The heart is normal in size.  Hepatobiliary: No focal liver abnormality is seen. No gallstones, gallbladder wall thickening, or biliary dilatation.  Pancreas: No ductal dilatation or inflammation.  Spleen: Normal in size without focal abnormality.  Adrenals/Urinary Tract: Normal adrenal glands. Obstructing elongated stone versus 2 adjacent stones at the left ureterovesicular junction measuring 8 mm  with moderate proximal hydroureteronephrosis. Mild left perinephric edema. There are at least 3 punctate additional nonobstructing stones in the mid lower left kidney. Probable cortical cyst in the posterior mid left kidney. There is no right hydronephrosis. Two right renal calculi, largest in the lower pole measuring 4 mm. Urinary bladder is only minimally distended. No bladder wall thickening.  Stomach/Bowel: Small hiatal hernia. The stomach is otherwise unremarkable. No bowel obstruction or inflammation. Normal appendix. Small to moderate colonic stool burden. Occasional colonic diverticula in the distal descending and sigmoid colon. No diverticulitis.  Vascular/Lymphatic: Normal caliber abdominal aorta with mild atherosclerosis. No enlarged lymph nodes in the abdomen or pelvis.  Reproductive: Prostate is unremarkable.  Other: No ascites or free air. Small fat containing umbilical hernia.  Musculoskeletal: There are no acute or suspicious osseous abnormalities.  IMPRESSION: 1. Obstructing elongated stone versus 2 adjacent stones at the left ureterovesicular junction measuring 8 mm with moderate proximal hydroureteronephrosis. 2. Additional nonobstructing stones in both kidneys. 3. Minimal colonic diverticulosis without diverticulitis.  4. Small hiatal hernia. 5. A 3 mm right lower lobe subpleural nodule. No follow-up needed if patient is low-risk. Non-contrast chest CT can be considered in 12 months if patient is high-risk. This recommendation follows the consensus statement: Guidelines for Management of Incidental Pulmonary Nodules Detected on CT Images: From the Fleischner Society 2017; Radiology 2017; 284:228-243. 6. Mild aortic and iliac atherosclerosis.  Aortic Atherosclerosis (ICD10-I70.0).   Electronically Signed By: Keith Rake M.D. On: 06/23/2021 18:18   Assessment & Plan:    1. Kidney stones -BMP, uric acid, iPTH. 24 hour urine - Urinalysis,  Routine w reflex microscopic   No follow-ups on file.  Nicolette Bang, MD  [facility] [place]

## 2021-07-01 NOTE — Patient Instructions (Signed)
Dietary Guidelines to Help Prevent Kidney Stones Kidney stones are deposits of minerals and salts that form inside your kidneys. Your risk of developing kidney stones may be greater depending on your diet, your lifestyle, the medicines you take, and whether you have certain medical conditions. Most people can lower their chances of developing kidney stones by following the instructions below. Your dietitian may give you more specific instructions depending on your overall health and the type of kidney stones you tend to develop. What are tips for following this plan? Reading food labels  Choose foods with "no salt added" or "low-salt" labels. Limit your salt (sodium) intake to less than 1,500 mg a day. Choose foods with calcium for each meal and snack. Try to eat about 300 mg of calcium at each meal. Foods that contain 200-500 mg of calcium a serving include: 8 oz (237 mL) of milk, calcium-fortifiednon-dairy milk, and calcium-fortifiedfruit juice. Calcium-fortified means that calcium has been added to these drinks. 8 oz (237 mL) of kefir, yogurt, and soy yogurt. 4 oz (114 g) of tofu. 1 oz (28 g) of cheese. 1 cup (150 g) of dried figs. 1 cup (91 g) of cooked broccoli. One 3 oz (85 g) can of sardines or mackerel. Most people need 1,000-1,500 mg of calcium a day. Talk to your dietitian about how much calcium is recommended for you. Shopping Buy plenty of fresh fruits and vegetables. Most people do not need to avoid fruits and vegetables, even if these foods contain nutrients that may contribute to kidney stones. When shopping for convenience foods, choose: Whole pieces of fruit. Pre-made salads with dressing on the side. Low-fat fruit and yogurt smoothies. Avoid buying frozen meals or prepared deli foods. These can be high in sodium. Look for foods with live cultures, such as yogurt and kefir. Choose high-fiber grains, such as whole-wheat breads, oat bran, and wheat cereals. Cooking Do not add  salt to food when cooking. Place a salt shaker on the table and allow each person to add his or her own salt to taste. Use vegetable protein, such as beans, textured vegetable protein (TVP), or tofu, instead of meat in pasta, casseroles, and soups. Meal planning Eat less salt, if told by your dietitian. To do this: Avoid eating processed or pre-made food. Avoid eating fast food. Eat less animal protein, including cheese, meat, poultry, or fish, if told by your dietitian. To do this: Limit the number of times you have meat, poultry, fish, or cheese each week. Eat a diet free of meat at least 2 days a week. Eat only one serving each day of meat, poultry, fish, or seafood. When you prepare animal protein, cut pieces into small portion sizes. For most meat and fish, one serving is about the size of the palm of your hand. Eat at least five servings of fresh fruits and vegetables each day. To do this: Keep fruits and vegetables on hand for snacks. Eat one piece of fruit or a handful of berries with breakfast. Have a salad and fruit at lunch. Have two kinds of vegetables at dinner. Limit foods that are high in a substance called oxalate. These include: Spinach (cooked), rhubarb, beets, sweet potatoes, and Swiss chard. Peanuts. Potato chips, french fries, and baked potatoes with skin on. Nuts and nut products. Chocolate. If you regularly take a diuretic medicine, make sure to eat at least 1 or 2 servings of fruits or vegetables that are high in potassium each day. These include: Avocado. Banana. Orange, prune,   carrot, or tomato juice. Baked potato. Cabbage. Beans and split peas. Lifestyle  Drink enough fluid to keep your urine pale yellow. This is the most important thing you can do. Spread your fluid intake throughout the day. If you drink alcohol: Limit how much you use to: 0-1 drink a day for women who are not pregnant. 0-2 drinks a day for men. Be aware of how much alcohol is in your  drink. In the [place] one drink equals one 12 oz bottle of beer (355 mL), one 5 oz glass of wine (148 mL), or one 1 oz glass of hard liquor (44 mL). Lose weight if told by your health care provider. Work with your dietitian to find an eating plan and weight loss strategies that work best for you. General information Talk to your health care provider and dietitian about taking daily supplements. You may be told the following depending on your health and the cause of your kidney stones: Not to take supplements with vitamin C. To take a calcium supplement. To take a daily probiotic supplement. To take other supplements such as magnesium, fish oil, or vitamin B6. Take over-the-counter and prescription medicines only as told by your health care provider. These include supplements. What foods should I limit? Limit your intake of the following foods, or eat them as told by your dietitian. Vegetables Spinach. Rhubarb. Beets. Canned vegetables. Pickles. Olives. Baked potatoes with skin. Grains Wheat bran. Baked goods. Salted crackers. Cereals high in sugar. Meats and other proteins Nuts. Nut butters. Large portions of meat, poultry, or fish. Salted, precooked, or cured meats, such as sausages, meat loaves, and hot dogs. Dairy Cheese. Beverages Regular soft drinks. Regular vegetable juice. Seasonings and condiments Seasoning blends with salt. Salad dressings. Soy sauce. Ketchup. Barbecue sauce. Other foods Canned soups. Canned pasta sauce. Casseroles. Pizza. Lasagna. Frozen meals. Potato chips. French fries. The items listed above may not be a complete list of foods and beverages you should limit. Contact a dietitian for more information. What foods should I avoid? Talk to your dietitian about specific foods you should avoid based on the type of kidney stones you have and your overall health. Fruits Grapefruit. The item listed above may not be a complete list of foods and beverages you should  avoid. Contact a dietitian for more information. Summary Kidney stones are deposits of minerals and salts that form inside your kidneys. You can lower your risk of kidney stones by making changes to your diet. The most important thing you can do is drink enough fluid. Drink enough fluid to keep your urine pale yellow. Talk to your dietitian about how much calcium you should have each day, and eat less salt and animal protein as told by your dietitian. This information is not intended to replace advice given to you by your health care provider. Make sure you discuss any questions you have with your health care provider. Document Revised: 04/14/2019 Document Reviewed: 04/14/2019 Elsevier Patient Education  2022 [employer]  

## 2021-07-02 LAB — URIC ACID: Uric Acid: 4.8 mg/dL (ref 3.8–8.4)

## 2021-07-02 LAB — BASIC METABOLIC PANEL
BUN/Creatinine Ratio: 16 (ref 9–20)
BUN: 16 mg/dL (ref 6–24)
CO2: 26 mmol/L (ref 20–29)
Calcium: 9.4 mg/dL (ref 8.7–10.2)
Chloride: 103 mmol/L (ref 96–106)
Creatinine, Ser: 0.97 mg/dL (ref 0.76–1.27)
Glucose: 93 mg/dL (ref 70–99)
Potassium: 4.3 mmol/L (ref 3.5–5.2)
Sodium: 144 mmol/L (ref 134–144)
eGFR: 100 mL/min/{1.73_m2} (ref >59)

## 2021-07-02 LAB — PTH, INTACT AND CALCIUM: PTH: 27 pg/mL (ref 15–65)

## 2021-08-06 ENCOUNTER — Ambulatory Visit: Payer: 59 | Admitting: Urology

## 2021-08-21 ENCOUNTER — Ambulatory Visit (HOSPITAL_COMMUNITY): Payer: 59

## 2021-08-28 ENCOUNTER — Ambulatory Visit: Payer: 59 | Admitting: Urology

## 2023-01-02 ENCOUNTER — Ambulatory Visit (INDEPENDENT_AMBULATORY_CARE_PROVIDER_SITE_OTHER): Payer: PRIVATE HEALTH INSURANCE | Admitting: Urology

## 2023-01-02 ENCOUNTER — Ambulatory Visit (HOSPITAL_COMMUNITY)
Admission: RE | Admit: 2023-01-02 | Discharge: 2023-01-02 | Disposition: A | Discharge status: Home / Self Care | Payer: PRIVATE HEALTH INSURANCE | Source: Ambulatory Visit | Attending: Urology | Admitting: Urology

## 2023-01-02 ENCOUNTER — Encounter: Payer: Self-pay | Admitting: Urology

## 2023-01-02 VITALS — BP 102/68 | HR 68

## 2023-01-02 DIAGNOSIS — N200 Calculus of kidney: Secondary | ICD-10-CM | POA: Insufficient documentation

## 2023-01-02 LAB — URINALYSIS, ROUTINE W REFLEX MICROSCOPIC
Bilirubin, UA: NEGATIVE
Glucose, UA: NEGATIVE
Ketones, UA: NEGATIVE
Leukocytes,UA: NEGATIVE
Nitrite, UA: NEGATIVE
Protein,UA: NEGATIVE
Specific Gravity, UA: 1.025 (ref 1.005–1.030)
Urobilinogen, Ur: 0.2 mg/dL (ref 0.2–1.0)
pH, UA: 6 (ref 5.0–7.5)

## 2023-01-02 LAB — MICROSCOPIC EXAMINATION: Bacteria, UA: NONE SEEN

## 2023-01-02 MED ORDER — OXYCODONE-ACETAMINOPHEN 5-325 MG PO TABS: 1.0000 | ORAL_TABLET | ORAL | 0 refills | Status: DC | PRN

## 2023-01-02 MED ORDER — TAMSULOSIN HCL 0.4 MG PO CAPS: 0.4000 mg | ORAL_CAPSULE | Freq: Every day | ORAL | 0 refills | Status: DC

## 2023-01-02 NOTE — Progress Notes (Signed)
01/02/2023 1:19 PM   Tyrone Ferguson [birth year] [id number]  Referring provider: Delano Metz, FNP [address] [place],  [place] [address]  nephrolithiasis   HPI: Tyrone Ferguson is a 41yo here for followup for nephrolithiasis. Starting Saturday he developed right flank pain. He presented to urgent care. UA was not concerning for infection. KUB from today shows a 8mm right proximal ureteral calculus. UA today shows 3-10 RBCs/hpf. No nausea/vomiting.    PMH: Past Medical History:  Diagnosis Date   Asthma    Renal disorder    kidney stones    Surgical History: Past Surgical History:  Procedure Laterality Date   CYSTOSCOPY W/ URETEROSCOPY W/ LITHOTRIPSY      Home Medications:  Allergies as of 01/02/2023       Reactions   Aspirin Nausea And Vomiting   Morphine And Codeine Nausea And Vomiting        Medication List        Accurate as of January 02, 2023  1:19 PM. If you have any questions, ask your nurse or doctor.          acetaminophen 500 MG tablet Commonly known as: TYLENOL Take 1,000 mg by mouth every 6 (six) hours as needed for pain.   albuterol 108 (90 Base) MCG/ACT inhaler Commonly known as: VENTOLIN HFA Inhale 2 puffs into the lungs every 6 (six) hours as needed for wheezing.   celecoxib 100 MG capsule Commonly known as: CeleBREX Take 1 capsule (100 mg total) by mouth 2 (two) times daily.   cephALEXin 500 MG capsule Commonly known as: KEFLEX Take 1 capsule (500 mg total) by mouth 4 (four) times daily.   ketorolac 10 MG tablet Commonly known as: TORADOL Take 10 mg by mouth 4 (four) times daily.   oxyCODONE-acetaminophen 5-325 MG tablet Commonly known as: PERCOCET/ROXICET Take 1 tablet by mouth every 4 (four) hours as needed.   oxyCODONE-acetaminophen 5-325 MG tablet Commonly known as: PERCOCET/ROXICET Take 1 tablet by mouth every 6 (six) hours as needed for severe pain.   tamsulosin 0.4 MG Caps capsule Commonly known as: Flomax Take 1  capsule (0.4 mg total) by mouth daily after supper.        Allergies:  Allergies  Allergen Reactions   Aspirin Nausea And Vomiting   Morphine And Codeine Nausea And Vomiting    Family History: No family history on file.  Social History:  reports that he has never smoked. He has never used smokeless tobacco. He reports that he does not drink alcohol. No history on file for drug use.  ROS: All other review of systems were reviewed and are negative except what is noted above in HPI  Physical Exam: BP 102/68   Pulse 68   Constitutional:  Alert and oriented, No acute distress. HEENT: [place] AT, moist mucus membranes.  Trachea midline, no masses. Cardiovascular: No clubbing, cyanosis, or edema. Respiratory: Normal respiratory effort, no increased work of breathing. GI: Abdomen is soft, nontender, nondistended, no abdominal masses GU: No CVA tenderness.  Lymph: No cervical or inguinal lymphadenopathy. Skin: No rashes, bruises or suspicious lesions. Neurologic: Grossly intact, no focal deficits, moving all 4 extremities. Psychiatric: Normal mood and affect.  Laboratory Data: Lab Results  Component Value Date   WBC 12.3 (H) 06/23/2021   HGB 13.6 06/23/2021   HCT 41.2 06/23/2021   MCV 95.8 06/23/2021   PLT 364 06/23/2021    Lab Results  Component Value Date   CREATININE 0.97 07/01/2021  No results found for: "PSA"  No results found for: "TESTOSTERONE"  No results found for: "HGBA1C"  Urinalysis    Component Value Date/Time   COLORURINE AMBER (A) 06/23/2021 2007   APPEARANCEUR Clear 07/01/2021 1118   LABSPEC 1.023 06/23/2021 2007   PHURINE 7.0 06/23/2021 2007   GLUCOSEU Negative 07/01/2021 1118   HGBUR MODERATE (A) 06/23/2021 2007   BILIRUBINUR Negative 07/01/2021 1118   KETONESUR NEGATIVE 06/23/2021 2007   PROTEINUR Negative 07/01/2021 1118   PROTEINUR 30 (A) 06/23/2021 2007   NITRITE Negative 07/01/2021 1118   NITRITE POSITIVE (A) 06/23/2021 2007    LEUKOCYTESUR Negative 07/01/2021 1118   LEUKOCYTESUR NEGATIVE 06/23/2021 2007    Lab Results  Component Value Date   LABMICR See below: 07/01/2021   WBCUA None seen 07/01/2021   LABEPIT 0-10 07/01/2021   MUCUS Present 07/01/2021   BACTERIA Few 07/01/2021    Pertinent Imaging: KUb today: Images reviewed and discussed with the patient  Results for orders placed in visit on 06/26/21  DG Abd 1 View  Narrative CLINICAL DATA:  Kidney stone.  EXAM: ABDOMEN - 1 VIEW  COMPARISON:  CT 06/23/2021  FINDINGS: The previous left ureterovesicular stone on prior CT is not seen on the current exam and may have passed. Two calcifications in the left pelvis are phleboliths when compared with prior CT. Chronic calcification in the left inguinal region. 4-5 mm lower pole calculus on the right, additional nonobstructing stones in the right kidney are not seen. Small nonobstructing left lower pole calculi. Normal bowel gas pattern without evidence of obstruction. No acute osseous findings.  IMPRESSION: 1. The previous left ureterovesicular stone on prior CT is not seen on the current exam and may have passed. 2. Bilateral nephrolithiasis.   Electronically Signed By: Narda Rutherford M.D. On: 07/02/2021 13:20  No results found for this or any previous visit.  No results found for this or any previous visit.  No results found for this or any previous visit.  No results found for this or any previous visit.  No valid procedures specified. No results found for this or any previous visit.  Results for orders placed during the hospital encounter of 06/23/21  CT Renal Stone Study  Narrative CLINICAL DATA:  Left flank pain.  EXAM: CT ABDOMEN AND PELVIS WITHOUT CONTRAST  TECHNIQUE: Multidetector CT imaging of the abdomen and pelvis was performed following the standard protocol without IV contrast.  RADIATION DOSE REDUCTION: This exam was performed according to the departmental  dose-optimization program which includes automated exposure control, adjustment of the mA and/or kV according to patient size and/or use of iterative reconstruction technique.  COMPARISON:  Remote abdominal CT 08/29/2005  FINDINGS: Lower chest: 3 mm right lower lobe subpleural nodule, series 4, image 4. No consolidation or pleural effusion. The heart is normal in size.  Hepatobiliary: No focal liver abnormality is seen. No gallstones, gallbladder wall thickening, or biliary dilatation.  Pancreas: No ductal dilatation or inflammation.  Spleen: Normal in size without focal abnormality.  Adrenals/Urinary Tract: Normal adrenal glands. Obstructing elongated stone versus 2 adjacent stones at the left ureterovesicular junction measuring 8 mm with moderate proximal hydroureteronephrosis. Mild left perinephric edema. There are at least 3 punctate additional nonobstructing stones in the mid lower left kidney. Probable cortical cyst in the posterior mid left kidney. There is no right hydronephrosis. Two right renal calculi, largest in the lower pole measuring 4 mm. Urinary bladder is only minimally distended. No bladder wall thickening.  Stomach/Bowel: Small hiatal hernia.  The stomach is otherwise unremarkable. No bowel obstruction or inflammation. Normal appendix. Small to moderate colonic stool burden. Occasional colonic diverticula in the distal descending and sigmoid colon. No diverticulitis.  Vascular/Lymphatic: Normal caliber abdominal aorta with mild atherosclerosis. No enlarged lymph nodes in the abdomen or pelvis.  Reproductive: Prostate is unremarkable.  Other: No ascites or free air. Small fat containing umbilical hernia.  Musculoskeletal: There are no acute or suspicious osseous abnormalities.  IMPRESSION: 1. Obstructing elongated stone versus 2 adjacent stones at the left ureterovesicular junction measuring 8 mm with moderate proximal hydroureteronephrosis. 2.  Additional nonobstructing stones in both kidneys. 3. Minimal colonic diverticulosis without diverticulitis. 4. Small hiatal hernia. 5. A 3 mm right lower lobe subpleural nodule. No follow-up needed if patient is low-risk. Non-contrast chest CT can be considered in 12 months if patient is high-risk. This recommendation follows the consensus statement: Guidelines for Management of Incidental Pulmonary Nodules Detected on CT Images: From the Fleischner Society 2017; Radiology 2017; 284:228-243. 6. Mild aortic and iliac atherosclerosis.  Aortic Atherosclerosis (ICD10-I70.0).   Electronically Signed By: Narda Rutherford M.D. On: 06/23/2021 18:18   Assessment & Plan:    1. Kidney stones -We discussed the management of kidney stones. These options include observation, ureteroscopy, shockwave lithotripsy (ESWL) and percutaneous nephrolithotomy (PCNL). We discussed which options are relevant to the patient's stone(s). We discussed the natural history of kidney stones as well as the complications of untreated stones and the impact on quality of life without treatment as well as with each of the above listed treatments. We also discussed the efficacy of each treatment in its ability to clear the stone burden. With any of these management options I discussed the signs and symptoms of infection and the need for emergent treatment should these be experienced. For each option we discussed the ability of each procedure to clear the patient of their stone burden.   For observation I described the risks which include but are not limited to silent renal damage, life-threatening infection, need for emergent surgery, failure to pass stone and pain.   For ureteroscopy I described the risks which include bleeding, infection, damage to contiguous structures, positioning injury, ureteral stricture, ureteral avulsion, ureteral injury, need for prolonged ureteral stent, inability to perform ureteroscopy, need for an  interval procedure, inability to clear stone burden, stent discomfort/pain, heart attack, stroke, pulmonary embolus and the inherent risks with general anesthesia.   For shockwave lithotripsy I described the risks which include arrhythmia, kidney contusion, kidney hemorrhage, need for transfusion, pain, inability to adequately break up stone, inability to pass stone fragments, Steinstrasse, infection associated with obstructing stones, need for alternate surgical procedure, need for repeat shockwave lithotripsy, MI, CVA, PE and the inherent risks with anesthesia/conscious sedation.   For PCNL I described the risks including positioning injury, pneumothorax, hydrothorax, need for chest tube, inability to clear stone burden, renal laceration, arterial venous fistula or malformation, need for embolization of kidney, loss of kidney or renal function, need for repeat procedure, need for prolonged nephrostomy tube, ureteral avulsion, MI, CVA, PE and the inherent risks of general anesthesia.   - The patient would like to proceed with right ESWL - DG Abd 1 View - Urinalysis, Routine w reflex microscopic   No follow-ups on file.  Wilkie Aye, MD  [facility]

## 2023-01-02 NOTE — H&P (View-Only) (Signed)
01/02/2023 1:19 PM   Tyrone Ferguson [birth year] [id number]  Referring provider: Delano Metz, FNP [address] [place],  [place] [address]  nephrolithiasis   HPI: [name] is a 41yo here for followup for nephrolithiasis. Starting Saturday he developed right flank pain. He presented to urgent care. UA was not concerning for infection. KUB from today shows a 8mm right proximal ureteral calculus. UA today shows 3-10 RBCs/hpf. No nausea/vomiting.    PMH: Past Medical History:  Diagnosis Date   Asthma    Renal disorder    kidney stones    Surgical History: Past Surgical History:  Procedure Laterality Date   CYSTOSCOPY W/ URETEROSCOPY W/ LITHOTRIPSY      Home Medications:  Allergies as of 01/02/2023       Reactions   Aspirin Nausea And Vomiting   Morphine And Codeine Nausea And Vomiting        Medication List        Accurate as of January 02, 2023  1:19 PM. If you have any questions, ask your nurse or doctor.          acetaminophen 500 MG tablet Commonly known as: TYLENOL Take 1,000 mg by mouth every 6 (six) hours as needed for pain.   albuterol 108 (90 Base) MCG/ACT inhaler Commonly known as: VENTOLIN HFA Inhale 2 puffs into the lungs every 6 (six) hours as needed for wheezing.   celecoxib 100 MG capsule Commonly known as: CeleBREX Take 1 capsule (100 mg total) by mouth 2 (two) times daily.   cephALEXin 500 MG capsule Commonly known as: KEFLEX Take 1 capsule (500 mg total) by mouth 4 (four) times daily.   ketorolac 10 MG tablet Commonly known as: TORADOL Take 10 mg by mouth 4 (four) times daily.   oxyCODONE-acetaminophen 5-325 MG tablet Commonly known as: PERCOCET/ROXICET Take 1 tablet by mouth every 4 (four) hours as needed.   oxyCODONE-acetaminophen 5-325 MG tablet Commonly known as: PERCOCET/ROXICET Take 1 tablet by mouth every 6 (six) hours as needed for severe pain.   tamsulosin 0.4 MG Caps capsule Commonly known as: Flomax Take 1  capsule (0.4 mg total) by mouth daily after supper.        Allergies:  Allergies  Allergen Reactions   Aspirin Nausea And Vomiting   Morphine And Codeine Nausea And Vomiting    Family History: No family history on file.  Social History:  reports that he has never smoked. He has never used smokeless tobacco. He reports that he does not drink alcohol. No history on file for drug use.  ROS: All other review of systems were reviewed and are negative except what is noted above in HPI  Physical Exam: BP 102/68   Pulse 68   Constitutional:  Alert and oriented, No acute distress. HEENT: [place] AT, moist mucus membranes.  Trachea midline, no masses. Cardiovascular: No clubbing, cyanosis, or edema. Respiratory: Normal respiratory effort, no increased work of breathing. GI: Abdomen is soft, nontender, nondistended, no abdominal masses GU: No CVA tenderness.  Lymph: No cervical or inguinal lymphadenopathy. Skin: No rashes, bruises or suspicious lesions. Neurologic: Grossly intact, no focal deficits, moving all 4 extremities. Psychiatric: Normal mood and affect.  Laboratory Data: Lab Results  Component Value Date   WBC 12.3 (H) 06/23/2021   HGB 13.6 06/23/2021   HCT 41.2 06/23/2021   MCV 95.8 06/23/2021   PLT 364 06/23/2021    Lab Results  Component Value Date   CREATININE 0.97 07/01/2021  No results found for: "PSA"  No results found for: "TESTOSTERONE"  No results found for: "HGBA1C"  Urinalysis    Component Value Date/Time   COLORURINE AMBER (A) 06/23/2021 2007   APPEARANCEUR Clear 07/01/2021 1118   LABSPEC 1.023 06/23/2021 2007   PHURINE 7.0 06/23/2021 2007   GLUCOSEU Negative 07/01/2021 1118   HGBUR MODERATE (A) 06/23/2021 2007   BILIRUBINUR Negative 07/01/2021 1118   KETONESUR NEGATIVE 06/23/2021 2007   PROTEINUR Negative 07/01/2021 1118   PROTEINUR 30 (A) 06/23/2021 2007   NITRITE Negative 07/01/2021 1118   NITRITE POSITIVE (A) 06/23/2021 2007    LEUKOCYTESUR Negative 07/01/2021 1118   LEUKOCYTESUR NEGATIVE 06/23/2021 2007    Lab Results  Component Value Date   LABMICR See below: 07/01/2021   WBCUA None seen 07/01/2021   LABEPIT 0-10 07/01/2021   MUCUS Present 07/01/2021   BACTERIA Few 07/01/2021    Pertinent Imaging: KUb today: Images reviewed and discussed with the patient  Results for orders placed in visit on 06/26/21  DG Abd 1 View  Narrative CLINICAL DATA:  Kidney stone.  EXAM: ABDOMEN - 1 VIEW  COMPARISON:  CT 06/23/2021  FINDINGS: The previous left ureterovesicular stone on prior CT is not seen on the current exam and may have passed. Two calcifications in the left pelvis are phleboliths when compared with prior CT. Chronic calcification in the left inguinal region. 4-5 mm lower pole calculus on the right, additional nonobstructing stones in the right kidney are not seen. Small nonobstructing left lower pole calculi. Normal bowel gas pattern without evidence of obstruction. No acute osseous findings.  IMPRESSION: 1. The previous left ureterovesicular stone on prior CT is not seen on the current exam and may have passed. 2. Bilateral nephrolithiasis.   Electronically Signed By: Narda Rutherford M.D. On: 07/02/2021 13:20  No results found for this or any previous visit.  No results found for this or any previous visit.  No results found for this or any previous visit.  No results found for this or any previous visit.  No valid procedures specified. No results found for this or any previous visit.  Results for orders placed during the hospital encounter of 06/23/21  CT Renal Stone Study  Narrative CLINICAL DATA:  Left flank pain.  EXAM: CT ABDOMEN AND PELVIS WITHOUT CONTRAST  TECHNIQUE: Multidetector CT imaging of the abdomen and pelvis was performed following the standard protocol without IV contrast.  RADIATION DOSE REDUCTION: This exam was performed according to the departmental  dose-optimization program which includes automated exposure control, adjustment of the mA and/or kV according to patient size and/or use of iterative reconstruction technique.  COMPARISON:  Remote abdominal CT 08/29/2005  FINDINGS: Lower chest: 3 mm right lower lobe subpleural nodule, series 4, image 4. No consolidation or pleural effusion. The heart is normal in size.  Hepatobiliary: No focal liver abnormality is seen. No gallstones, gallbladder wall thickening, or biliary dilatation.  Pancreas: No ductal dilatation or inflammation.  Spleen: Normal in size without focal abnormality.  Adrenals/Urinary Tract: Normal adrenal glands. Obstructing elongated stone versus 2 adjacent stones at the left ureterovesicular junction measuring 8 mm with moderate proximal hydroureteronephrosis. Mild left perinephric edema. There are at least 3 punctate additional nonobstructing stones in the mid lower left kidney. Probable cortical cyst in the posterior mid left kidney. There is no right hydronephrosis. Two right renal calculi, largest in the lower pole measuring 4 mm. Urinary bladder is only minimally distended. No bladder wall thickening.  Stomach/Bowel: Small hiatal hernia.  The stomach is otherwise unremarkable. No bowel obstruction or inflammation. Normal appendix. Small to moderate colonic stool burden. Occasional colonic diverticula in the distal descending and sigmoid colon. No diverticulitis.  Vascular/Lymphatic: Normal caliber abdominal aorta with mild atherosclerosis. No enlarged lymph nodes in the abdomen or pelvis.  Reproductive: Prostate is unremarkable.  Other: No ascites or free air. Small fat containing umbilical hernia.  Musculoskeletal: There are no acute or suspicious osseous abnormalities.  IMPRESSION: 1. Obstructing elongated stone versus 2 adjacent stones at the left ureterovesicular junction measuring 8 mm with moderate proximal hydroureteronephrosis. 2.  Additional nonobstructing stones in both kidneys. 3. Minimal colonic diverticulosis without diverticulitis. 4. Small hiatal hernia. 5. A 3 mm right lower lobe subpleural nodule. No follow-up needed if patient is low-risk. Non-contrast chest CT can be considered in 12 months if patient is high-risk. This recommendation follows the consensus statement: Guidelines for Management of Incidental Pulmonary Nodules Detected on CT Images: From the Fleischner Society 2017; Radiology 2017; 284:228-243. 6. Mild aortic and iliac atherosclerosis.  Aortic Atherosclerosis (ICD10-I70.0).   Electronically Signed By: Narda Rutherford M.D. On: 06/23/2021 18:18   Assessment & Plan:    1. Kidney stones -We discussed the management of kidney stones. These options include observation, ureteroscopy, shockwave lithotripsy (ESWL) and percutaneous nephrolithotomy (PCNL). We discussed which options are relevant to the patient's stone(s). We discussed the natural history of kidney stones as well as the complications of untreated stones and the impact on quality of life without treatment as well as with each of the above listed treatments. We also discussed the efficacy of each treatment in its ability to clear the stone burden. With any of these management options I discussed the signs and symptoms of infection and the need for emergent treatment should these be experienced. For each option we discussed the ability of each procedure to clear the patient of their stone burden.   For observation I described the risks which include but are not limited to silent renal damage, life-threatening infection, need for emergent surgery, failure to pass stone and pain.   For ureteroscopy I described the risks which include bleeding, infection, damage to contiguous structures, positioning injury, ureteral stricture, ureteral avulsion, ureteral injury, need for prolonged ureteral stent, inability to perform ureteroscopy, need for an  interval procedure, inability to clear stone burden, stent discomfort/pain, heart attack, stroke, pulmonary embolus and the inherent risks with general anesthesia.   For shockwave lithotripsy I described the risks which include arrhythmia, kidney contusion, kidney hemorrhage, need for transfusion, pain, inability to adequately break up stone, inability to pass stone fragments, Steinstrasse, infection associated with obstructing stones, need for alternate surgical procedure, need for repeat shockwave lithotripsy, MI, CVA, PE and the inherent risks with anesthesia/conscious sedation.   For PCNL I described the risks including positioning injury, pneumothorax, hydrothorax, need for chest tube, inability to clear stone burden, renal laceration, arterial venous fistula or malformation, need for embolization of kidney, loss of kidney or renal function, need for repeat procedure, need for prolonged nephrostomy tube, ureteral avulsion, MI, CVA, PE and the inherent risks of general anesthesia.   - The patient would like to proceed with right ESWL - DG Abd 1 View - Urinalysis, Routine w reflex microscopic   No follow-ups on file.  Wilkie Aye, MD  [facility]

## 2023-01-02 NOTE — Patient Instructions (Signed)
 ESWL for Kidney Stones  Extracorporeal shock wave lithotripsy (ESWL) is a treatment that can help break up kidney stones that are too large to pass on their own.  This is a nonsurgical procedure that breaks up a kidney stone with shock waves. These shock waves pass through your body and focus on the kidney stone. They cause the kidney stone to break into smaller pieces (fragments) while it is still in the urinary tract. The fragments of stone can pass more easily out of your body in the urine. Tell a health care provider about: Any allergies you have. All medicines you are taking, including vitamins, herbs, eye drops, creams, and over-the-counter medicines. Any problems you or family members have had with anesthetic medicines. Any bleeding problems you have. Any surgeries you have had. Any medical conditions you have. Whether you are pregnant or may be pregnant. What are the risks? Your health care provider will talk with you about risks. These may include: Infection. Bleeding from the kidney. Bruising of the kidney or skin. Scarring of the kidney. This can lead to: Increased blood pressure. Poor kidney function. Return (recurrence) of kidney stones. Damage to other structures or organs. This may include the liver, colon, spleen, or pancreas. Blockage (obstruction) of the tube that carries urine from the kidney to the bladder (ureter). Failure of the kidney stone to break into fragments. What happens before the procedure? When to stop eating and drinking Follow instructions from your health care provider about what you may eat and drink. These may include: 8 hours before your procedure Stop eating most foods. Do not eat meat, fried foods, or fatty foods. Eat only light foods, such as toast or crackers. All liquids are okay except energy drinks and alcohol. 6 hours before your procedure Stop eating. Drink only clear liquids, such as water, clear fruit juice, black coffee, plain tea,  and sports drinks. Do not drink energy drinks or alcohol. 2 hours before your procedure Stop drinking all liquids. You may be allowed to take medicines with small sips of water. If you do not follow your health care provider's instructions, your procedure may be delayed or canceled. Medicines Ask your health care provider about: Changing or stopping your regular medicines. These include any diabetes medicines or blood thinners you take. Taking medicines such as aspirin and ibuprofen. These medicines can thin your blood. Do not take them unless your health care provider tells you to. Taking over-the-counter medicines, vitamins, herbs, and supplements. Tests You may have tests, such as: Blood tests. Urine tests. Imaging tests. This may include a CT scan. Surgery safety Ask your health care provider: How your surgery site will be marked. What steps will be taken to help prevent infection. These steps may include: Washing skin with a soap that kills germs. Receiving antibiotics. General instructions If you will be going home right after the procedure, plan to have a responsible adult: Take you home from the hospital or clinic. You will not be allowed to drive. Care for you for the time you are told. What happens during the procedure?  An IV will be inserted into one of your veins. You may be given: A sedative. This helps you relax. Anesthesia. This will: Numb certain areas of your body. Make you fall asleep for surgery. A water-filled cushion may be placed behind your kidney or on your abdomen. In some cases, you may be placed in a tub of lukewarm water. Your body will be positioned in a way that makes it  easier to target the kidney stone. An X-ray or ultrasound exam will be done to locate your stone. Shock waves will be aimed at the stone. If you are awake, you may feel a tapping sensation as the shock waves pass through your body. A small mesh tube (stent) may be placed in your  ureter. This will help keep urine flowing from the kidney if the fragments of the stone have been blocking the ureter. The stent will be removed at a later time by your health care provider. The procedure may vary among health care providers and hospitals. What happens after the procedure? Your blood pressure, heart rate, breathing rate, and blood oxygen level will be monitored until you leave the hospital or clinic. You may have an X-ray after the procedure to see how many of the kidney stones were broken up. This will also show how much of the stone has passed. If there are still large fragments after treatment, you may need to have a second procedure at a later time. This information is not intended to replace advice given to you by your health care provider. Make sure you discuss any questions you have with your health care provider. Document Revised: 07/18/2022 Document Reviewed: 08/22/2021 Elsevier Patient Education  2024 [employer].

## 2023-01-09 ENCOUNTER — Other Ambulatory Visit: Payer: Self-pay

## 2023-01-09 DIAGNOSIS — N200 Calculus of kidney: Secondary | ICD-10-CM

## 2023-01-12 ENCOUNTER — Encounter (HOSPITAL_COMMUNITY): Payer: Self-pay

## 2023-01-12 ENCOUNTER — Encounter (HOSPITAL_COMMUNITY)
Admission: RE | Admit: 2023-01-12 | Discharge: 2023-01-12 | Disposition: A | Discharge status: Home / Self Care | Payer: PRIVATE HEALTH INSURANCE | Source: Ambulatory Visit | Attending: Urology | Admitting: Urology

## 2023-01-13 ENCOUNTER — Ambulatory Visit (HOSPITAL_COMMUNITY)
Admission: RE | Admit: 2023-01-13 | Discharge: 2023-01-13 | Disposition: A | Discharge status: Home / Self Care | Payer: PRIVATE HEALTH INSURANCE | Attending: Urology | Admitting: Urology

## 2023-01-13 ENCOUNTER — Ambulatory Visit (HOSPITAL_COMMUNITY): Payer: PRIVATE HEALTH INSURANCE

## 2023-01-13 ENCOUNTER — Encounter (HOSPITAL_COMMUNITY)
Admission: RE | Disposition: A | Discharge status: Home / Self Care | Payer: Self-pay | Source: Home / Self Care | Attending: Urology

## 2023-01-13 ENCOUNTER — Encounter (HOSPITAL_COMMUNITY): Payer: Self-pay | Admitting: Urology

## 2023-01-13 DIAGNOSIS — E669 Obesity, unspecified: Secondary | ICD-10-CM | POA: Insufficient documentation

## 2023-01-13 DIAGNOSIS — Z6832 Body mass index (BMI) 32.0-32.9, adult: Secondary | ICD-10-CM | POA: Diagnosis not present

## 2023-01-13 DIAGNOSIS — N132 Hydronephrosis with renal and ureteral calculous obstruction: Secondary | ICD-10-CM | POA: Diagnosis present

## 2023-01-13 DIAGNOSIS — N201 Calculus of ureter: Secondary | ICD-10-CM | POA: Diagnosis not present

## 2023-01-13 DIAGNOSIS — J45909 Unspecified asthma, uncomplicated: Secondary | ICD-10-CM | POA: Diagnosis not present

## 2023-01-13 HISTORY — PX: EXTRACORPOREAL SHOCK WAVE LITHOTRIPSY: SHX1557

## 2023-01-13 SURGERY — EXTRACORPOREAL SHOCK WAVE LITHOTRIPSY (ESWL)
Anesthesia: LOCAL | Laterality: Right

## 2023-01-13 MED ORDER — OXYCODONE-ACETAMINOPHEN 5-325 MG PO TABS: 1.0000 | ORAL_TABLET | ORAL | 0 refills | Status: DC | PRN

## 2023-01-13 MED ORDER — SODIUM CHLORIDE 0.9 % IV SOLN: INTRAVENOUS | Status: DC

## 2023-01-13 MED ORDER — DIAZEPAM 5 MG PO TABS
10.0000 mg | ORAL_TABLET | Freq: Once | ORAL | Status: AC
  Administered 2023-01-13: 10 mg via ORAL
  Filled 2023-01-13: qty 2

## 2023-01-13 MED ORDER — DIPHENHYDRAMINE HCL 25 MG PO CAPS
25.0000 mg | ORAL_CAPSULE | ORAL | Status: AC
  Administered 2023-01-13: 25 mg via ORAL
  Filled 2023-01-13: qty 1

## 2023-01-13 MED ORDER — ONDANSETRON HCL 4 MG PO TABS: 4.0000 mg | ORAL_TABLET | Freq: Every day | ORAL | 1 refills | Status: AC | PRN

## 2023-01-13 MED ORDER — TAMSULOSIN HCL 0.4 MG PO CAPS: 0.4000 mg | ORAL_CAPSULE | Freq: Every day | ORAL | 0 refills | Status: DC

## 2023-01-13 NOTE — Interval H&P Note (Signed)
History and Physical Interval Note:  01/13/2023 8:57 AM  Tyrone Ferguson  has presented today for surgery, with the diagnosis of Right Ureteral Stone.  The various methods of treatment have been discussed with the patient and family. After consideration of risks, benefits and other options for treatment, the patient has consented to  Procedure(s): EXTRACORPOREAL SHOCK WAVE LITHOTRIPSY (ESWL) (Right) as a surgical intervention.  The patient's history has been reviewed, patient examined, no change in status, stable for surgery.  I have reviewed the patient's chart and labs.  Questions were answered to the patient's satisfaction.     Wilkie Aye

## 2023-01-15 ENCOUNTER — Encounter (HOSPITAL_COMMUNITY): Payer: Self-pay | Admitting: Urology

## 2023-02-03 ENCOUNTER — Encounter: Payer: PRIVATE HEALTH INSURANCE | Admitting: Urology

## 2023-02-10 DIAGNOSIS — N200 Calculus of kidney: Secondary | ICD-10-CM | POA: Insufficient documentation

## 2023-02-10 NOTE — Progress Notes (Signed)
Name: Tyrone Ferguson DOB: Dec 04, 1980 MRN: [id number]  Diagnoses: 1) Post-operative state  HPI: Thaer Gongwer presents post-operatively s/p right ESWL procedure on 01/13/2023 by Dr. Ronne Binning for management of a right proximal ureteral stone.  Postop course: KUB today: Awaiting radiology read.  He reports that after surgery he recovered well. He incidentally passed the additional right stone (which had previously been intrarenal before his ESWL for the right proximal ureteral stone) on 02/10/2023.   Today He denies flank pain or abdominal pain. He denies fevers, nausea, or vomiting. He denies increased urinary urgency, frequency, nocturia, dysuria, gross hematuria, hesitancy, straining to void, or sensations of incomplete emptying.  He brought stone pieces for analysis.   Fall Screening: Do you usually have a device to assist in your mobility? No   Medications: Current Outpatient Medications  Medication Sig Dispense Refill   ketorolac (TORADOL) 10 MG tablet Take 10 mg by mouth 4 (four) times daily.     ondansetron (ZOFRAN) 4 MG tablet Take 1 tablet (4 mg total) by mouth daily as needed for nausea or vomiting. 30 tablet 1   tamsulosin (FLOMAX) 0.4 MG CAPS capsule Take 1 capsule (0.4 mg total) by mouth daily after supper. 30 capsule 0   oxyCODONE-acetaminophen (PERCOCET/ROXICET) 5-325 MG tablet Take 1 tablet by mouth every 4 (four) hours as needed. (Patient not taking: Reported on 02/16/2023) 30 tablet 0   No current facility-administered medications for this visit.    Allergies: Allergies  Allergen Reactions   Aspirin Nausea And Vomiting   Morphine And Codeine Nausea And Vomiting    Past Medical History:  Diagnosis Date   Asthma    Renal disorder    kidney stones   Past Surgical History:  Procedure Laterality Date   CYSTOSCOPY W/ URETEROSCOPY W/ LITHOTRIPSY     EXTRACORPOREAL SHOCK WAVE LITHOTRIPSY Right 01/13/2023   Procedure: EXTRACORPOREAL SHOCK WAVE LITHOTRIPSY (ESWL);   Surgeon: Malen Gauze, MD;  Location: [facility];  Service: Urology;  Laterality: Right;   History reviewed. No pertinent family history. Social History   Socioeconomic History   Marital status: Single    Spouse name: Not on file   Number of children: Not on file   Years of education: Not on file   Highest education level: Not on file  Occupational History   Not on file  Tobacco Use   Smoking status: Never   Smokeless tobacco: Never  Substance and Sexual Activity   Alcohol use: No   Drug use: Never   Sexual activity: Not on file  Other Topics Concern   Not on file  Social History Narrative   Not on file   Social Determinants of Health   Financial Resource Strain: Not on file  Food Insecurity: Not on file  Transportation Needs: Not on file  Physical Activity: Not on file  Stress: Not on file  Social Connections: Not on file  Intimate Partner Violence: Not on file    SUBJECTIVE  Review of Systems Constitutional: Patient denies any unintentional weight loss or change in strength lntegumentary: Patient denies any rashes or pruritus Cardiovascular: Patient denies chest pain or syncope Respiratory: Patient denies shortness of breath Gastrointestinal: Patient denies nausea, vomiting, constipation, or diarrhea Musculoskeletal: Patient denies muscle cramps or weakness Neurologic: Patient denies convulsions or seizures Allergic/Immunologic: Patient denies recent allergic reaction(s) Hematologic/Lymphatic: Patient denies bleeding tendencies Endocrine: Patient denies heat/cold intolerance  GU: As per HPI.  OBJECTIVE Vitals:   02/16/23 1312  BP: 106/68  Pulse: 67  Temp: 98.2  F (36.8 C)   Body mass index is 32.91 kg/m.  Physical Examination Constitutional: No obvious distress; patient is non-toxic appearing  Cardiovascular: No visible lower extremity edema.  Respiratory: The patient does not have audible wheezing/stridor; respirations do not appear labored   Gastrointestinal: Abdomen non-distended Musculoskeletal: Normal ROM of UEs  Skin: No obvious rashes/open sores  Neurologic: CN 2-12 grossly intact Psychiatric: Answered questions appropriately with normal affect  Hematologic/Lymphatic/Immunologic: No obvious bruises or sites of spontaneous bleeding  UA: 3-10 RBC/hpf; no evidence of UTI   ASSESSMENT Right ureteral stone - Plan: Urinalysis, Routine w reflex microscopic, Calculi, with Photograph (to Clinical Lab)  Postop check  Kidney stones - Plan: DG Abd 1 View, Ambulatory referral to Internal Medicine  We reviewed the operative procedures and findings. He is doing well. Pre-operative symptoms are resolved since the procedure. We reviewed recent imaging results; awaiting radiology results, appears to have no acute findings.  Will send stone pieces for compositional analysis.   For stone prevention: Advised adequate hydration and we discussed option to consider low oxalate diet given that calcium oxalate is the most common type of stone. Handout provided about stone prevention diet.  Will plan to follow up in 6 months with KUB for stone surveillance or sooner if needed. Pt verbalized understanding and agreement. All questions were answered.  PLAN Advised the following: Maintain adequate fluid intake. Low oxalate diet. Return in about 6 months (around 08/17/2023) for KUB, UA, & f/u with Evette Georges NP.   Orders Placed This Encounter  Procedures   DG Abd 1 View    Standing Status:   Future    Standing Expiration Date:   02/16/2024    Order Specific Question:   Reason for Exam (SYMPTOM  OR DIAGNOSIS REQUIRED)    Answer:   kidney stone    Order Specific Question:   Preferred imaging location?    Answer:   [facility]   Urinalysis, Routine w reflex microscopic   Calculi, with Photograph (to Clinical Lab)   Ambulatory referral to Internal Medicine    Referral Priority:   Routine    Referral Type:   Consultation     Referral Reason:   Specialty Services Required    Requested Specialty:   Internal Medicine    Number of Visits Requested:   1    It has been explained that the patient is to follow regularly with their PCP in addition to all other providers involved in their care and to follow instructions provided by these respective offices. Patient advised to contact urology clinic if any urologic-pertaining questions, concerns, new symptoms or problems arise in the interim period.  Patient Instructions  >80% of stones are calcium oxalate. This type of stones forms when body either isn't clearing oxalate well enough, is making too much oxalate, or too little citrate. This results in oxalate binding to form crystals, which continue to aggregate and form stones.  Limiting calcium does not help, but limiting oxalate in the diet can help. Increasing citric acid intake may also help.  The following measures may help to prevent the recurrence of stones: Increase water intake to 2-2.5 liters per day May add citrus juice (lemon, lime or orange juice) to water Moderation in dairy foods Decrease in salt content 5. Low Oxalate diet: Oxylates are found in foods like Tomato, Spinach, red wine and chocolate (see additional resources below).  Internet resources for information regarding low oxalate diet: https://kidneystones.yangchunwu.com https://my.VerticalStretch.be  Foods Low in Sodium or Oxalate  Foods You Can Eat  Drinks Coffee, fruit and veggie juice (using the recommended veggies), fruit punch  Fruits Apples, apricots (fresh or canned), avocado, bananas, cherries (sweet), cranberries, grapefruit, red or green grapes, lemon and lime juice, melons, nectarines, papayas, peaches, pears, pineapples, oranges, strawberries (fresh), tangerines  Veggies Artichokes, asparagus, bamboo shoots, broccoli, brussels sprouts, cabbage,  cauliflower, chayote squash, chicory, corn, cucumbers, endive, lettuce, lima beans, mushrooms, onions, peas, peppers, potatoes, radishes, rutabagas, zucchini  Breads, Cereals, Grains Egg noodles, rye bread, cooked and dry cereals without nuts or bran, crackers with unsalted tops, white or wild rice  Meat, Meat Replacements, Fish, Recruitment consultant, fish, poultry, eggs, egg whites, egg replacements  Soup Homemade soup (using the recommended veggies and meat), low-sodium bouillon, low-sodium canned  Desserts Cookies, cakes, ice cream, pudding without chocolate or nuts, candy without chocolate or nuts  Fats and Oils Butter, margarine, cream, oil, salad dressing, mayo  Other Foods Unsalted potato chips or pretzels, herbs (like garlic, garlic powder, onion powder), lemon juice, salt-free seasoning blends, vinegar  Other Foods Low in Oxalate Foods You Can Eat  Drinks Beer, cola, wine, buttermilk, lemonade or limeade (without added vitamin C), milk  Meat, Meat Replacements, Fish, [employer] meat, ham, bacon, hot dogs, bratwurst, sausage, chicken nuggets, cheddar cheese, canned fish and shellfish  Soup Tomato soup, cheese soup  Other Foods Coconuts, lemon or lime juices, sugar or sweeteners, jellies or jams (from the recommended list)   Moderate-Oxalate Foods Foods to Limit   Drinks Fruit and veggie juices (from the list below), chocolate milk, rice milk, hot cocoa, tea   Fruits Blackberries, blueberries, black currants, cherries (sour), fruit cocktail, mangoes, orange peel, prunes, purple plums   Veggies Baked beans, carrots, celery, green beans, parsnips, summer squash, tomatoes, turnips   Breads, Cereals, Grains White bread, cornbread or cornmeal, white English muffins, saltine or soda crackers, brown rice, vanilla wafers, spaghetti and other noodles, firm tofu, bagels, oatmeal   Meat/meat replacements, fish, poultry Sardines   Desserts Chocolate cake   Fats and Oils Macadamia nuts, pistachio nuts,  English walnuts   Other Foods Jams or jellies (made with the fruits above), pepper    High-Oxalate Foods Foods to Avoid Drinks Chocolate drink mixes, soy milk, Ovaltine, instant iced tea, fruit juices of fruits listed below Fruits Apricots (dried), red currants, figs, kiwi, plums, rhubarb Veggies Beans (wax, dried), beets and beet greens, chives, collard greens, eggplant, escarole, dark greens of all kinds, leeks, okra, parsley, rutabagas, spinach, Swiss chard, tomato paste, watercress Breads, Cereals, Grains Amaranth, barley, white corn flour, fried potatoes, fruitcake, grits, soybean products, sweet potatoes, wheat germ and bran, buckwheat flour, All Bran cereal, graham crackers, pretzels, whole wheat bread Meat/meat replacements, fish, poultry  Dried beans, peanut butter, soy burgers, miso  Desserts Carob, chocolate, marmalades Fats and Oils Nuts (peanuts, almonds, pecans, cashews, hazelnuts), nut butters, sesame seeds, tahini paste Other Foods Poppy seeds   Electronically signed by:  Donnita Falls, MSN, FNP-C, CUNP 02/16/2023 1:41 PM

## 2023-02-16 ENCOUNTER — Encounter: Payer: Self-pay | Admitting: Urology

## 2023-02-16 ENCOUNTER — Ambulatory Visit (INDEPENDENT_AMBULATORY_CARE_PROVIDER_SITE_OTHER): Payer: PRIVATE HEALTH INSURANCE | Admitting: Urology

## 2023-02-16 ENCOUNTER — Ambulatory Visit (HOSPITAL_COMMUNITY)
Admission: RE | Admit: 2023-02-16 | Discharge: 2023-02-16 | Disposition: A | Discharge status: Home / Self Care | Payer: PRIVATE HEALTH INSURANCE | Source: Ambulatory Visit | Attending: Urology | Admitting: Urology

## 2023-02-16 VITALS — BP 106/68 | HR 67 | Temp 98.2°F | Ht 67.0 in | Wt 210.1 lb

## 2023-02-16 DIAGNOSIS — N200 Calculus of kidney: Secondary | ICD-10-CM

## 2023-02-16 DIAGNOSIS — Z09 Encounter for follow-up examination after completed treatment for conditions other than malignant neoplasm: Secondary | ICD-10-CM

## 2023-02-16 DIAGNOSIS — Z87442 Personal history of urinary calculi: Secondary | ICD-10-CM

## 2023-02-16 DIAGNOSIS — N201 Calculus of ureter: Secondary | ICD-10-CM

## 2023-02-16 LAB — URINALYSIS, ROUTINE W REFLEX MICROSCOPIC
Bilirubin, UA: NEGATIVE
Glucose, UA: NEGATIVE
Ketones, UA: NEGATIVE
Leukocytes,UA: NEGATIVE
Nitrite, UA: NEGATIVE
Protein,UA: NEGATIVE
Specific Gravity, UA: 1.025 (ref 1.005–1.030)
Urobilinogen, Ur: 1 mg/dL (ref 0.2–1.0)
pH, UA: 6 (ref 5.0–7.5)

## 2023-02-16 LAB — MICROSCOPIC EXAMINATION
Bacteria, UA: NONE SEEN
Epithelial Cells (non renal): NONE SEEN /[HPF] (ref 0–10)
WBC, UA: NONE SEEN /[HPF] (ref 0–5)

## 2023-02-16 NOTE — Patient Instructions (Signed)

## 2023-02-24 LAB — CALCULI, WITH PHOTOGRAPH (CLINICAL LAB)
Calcium Oxalate Dihydrate: 30 %
Calcium Oxalate Monohydrate: 50 %
Hydroxyapatite: 20 %
Weight Calculi: 93 mg

## 2023-02-24 NOTE — Progress Notes (Signed)
Letter sent.

## 2023-07-19 IMAGING — DX DG ABDOMEN 1V
2 series · 2 of 2 positions shown · non-contrast
Comparison: CT 06/23/2021

CLINICAL DATA: Kidney stone.

EXAM:
ABDOMEN - 1 VIEW

[abdomen kub (1 of 2)]
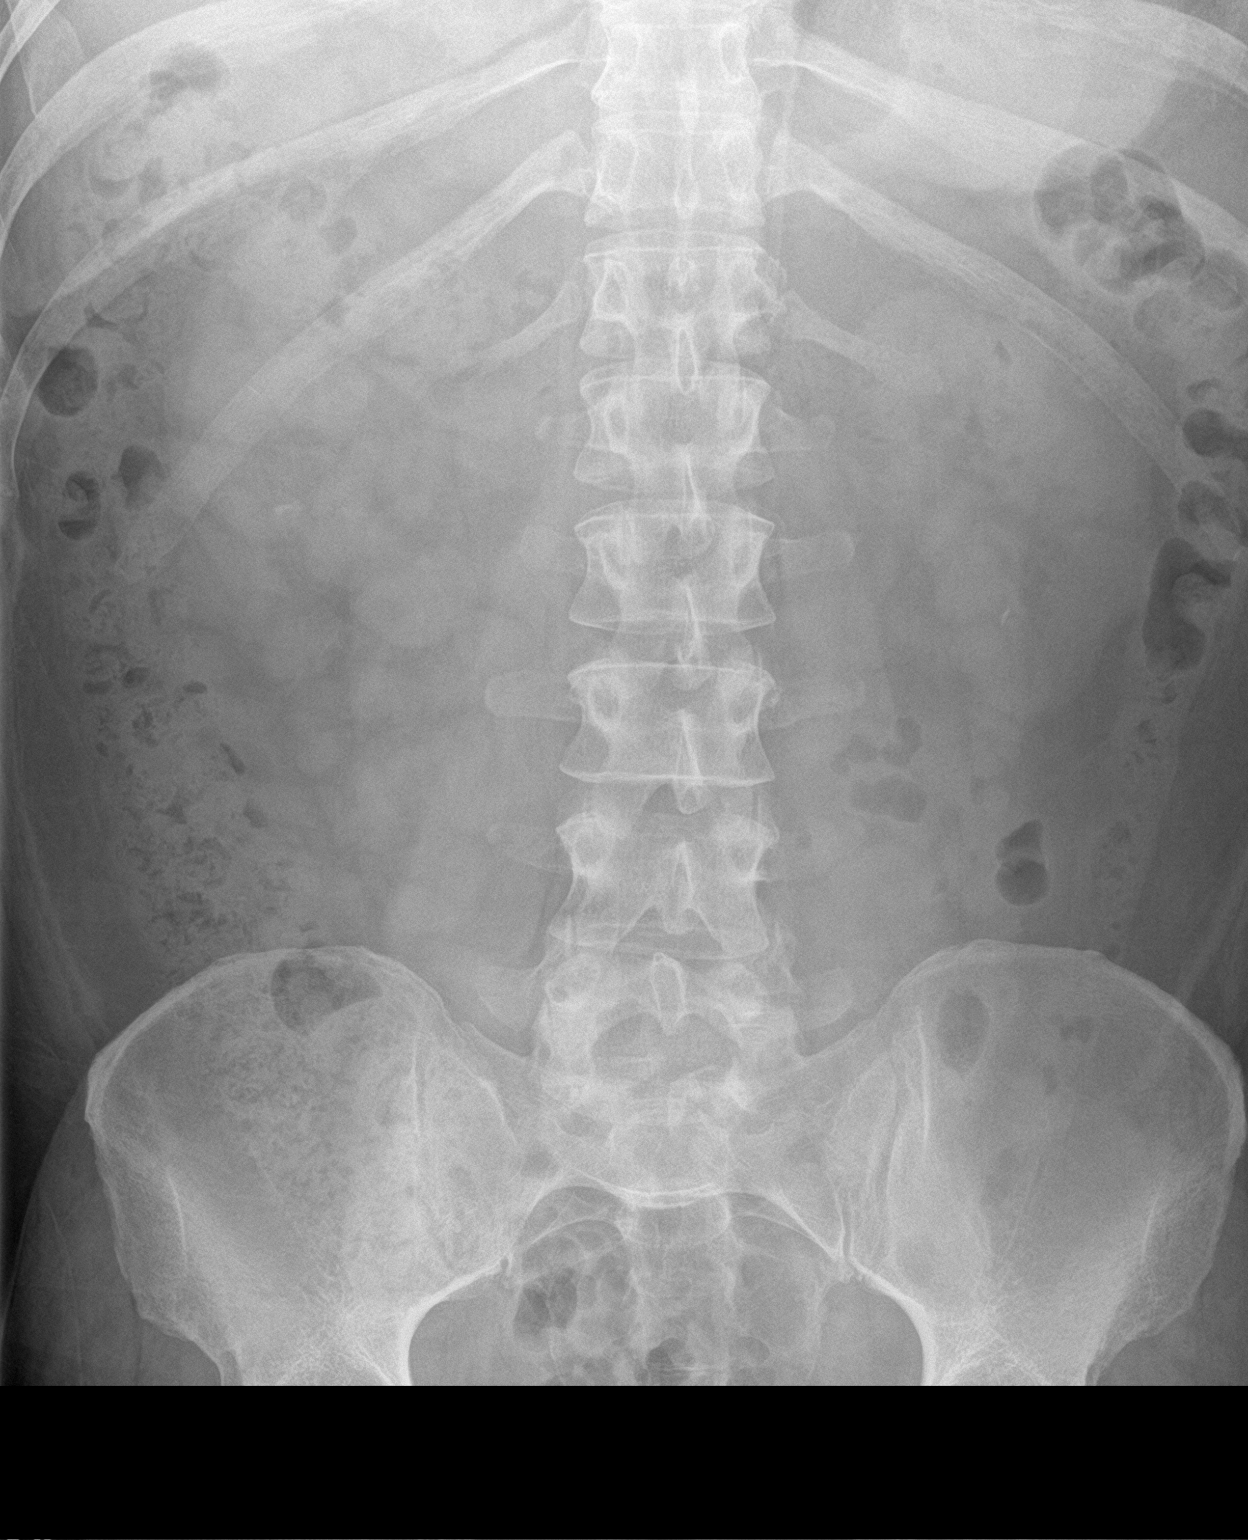

[abdomen kub (2 of 2)]
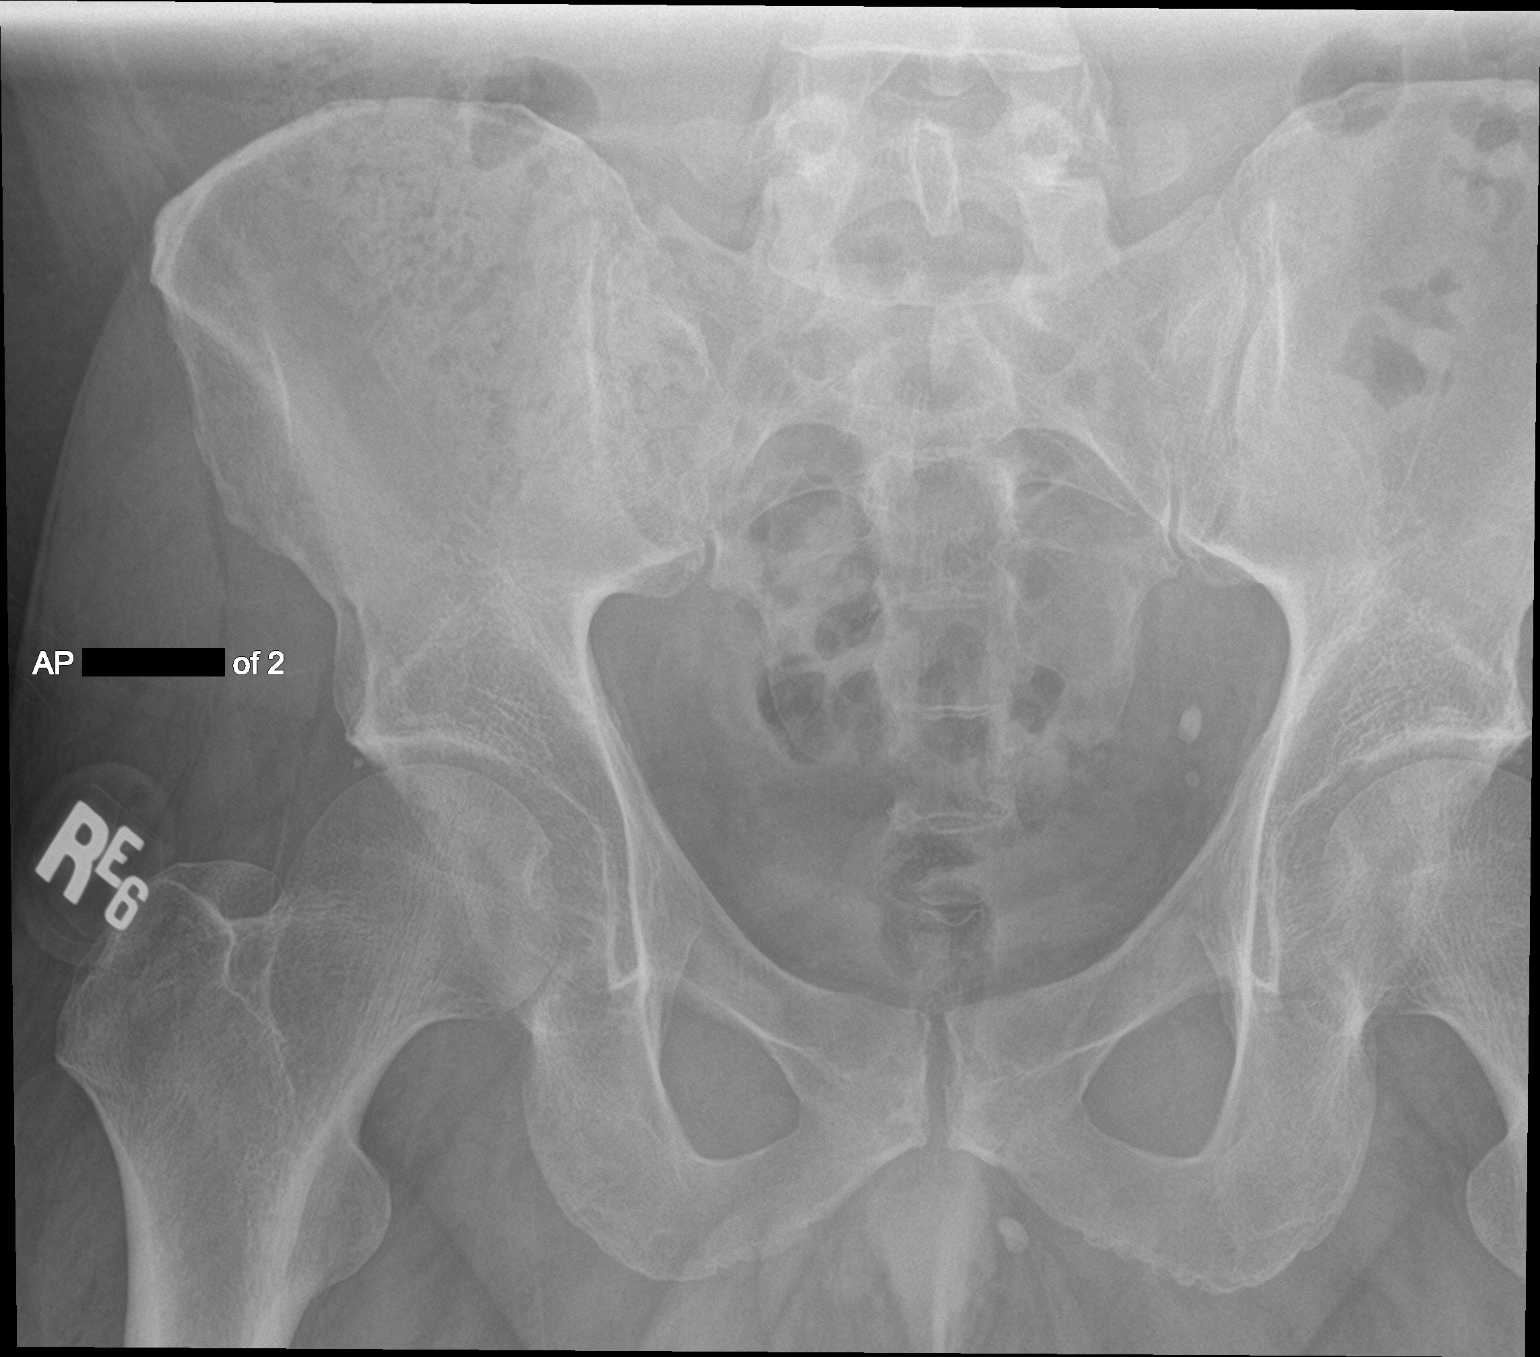

[2 of 2 positions shown; findings below may reference images not displayed]

FINDINGS: The previous left ureterovesicular stone on prior CT is not seen on
the current exam and may have passed. Two calcifications in the left
pelvis are phleboliths when compared with prior CT. Chronic
calcification in the left inguinal region. 4-5 mm lower pole
calculus on the right, additional nonobstructing stones in the right
kidney are not seen. Small nonobstructing left lower pole calculi.
Normal bowel gas pattern without evidence of obstruction. No acute
osseous findings.
IMPRESSION: 1. The previous left ureterovesicular stone on prior CT is not seen
on the current exam and may have passed.
2. Bilateral nephrolithiasis.

## 2023-09-01 ENCOUNTER — Ambulatory Visit: Payer: PRIVATE HEALTH INSURANCE | Admitting: Urology

## 2023-09-03 ENCOUNTER — Ambulatory Visit: Payer: PRIVATE HEALTH INSURANCE | Admitting: Urology

## 2023-09-15 ENCOUNTER — Ambulatory Visit: Payer: PRIVATE HEALTH INSURANCE | Admitting: Urology

## 2024-02-09 ENCOUNTER — Emergency Department (HOSPITAL_COMMUNITY)
Admission: EM | Admit: 2024-02-09 | Discharge: 2024-02-09 | Disposition: A | Discharge status: Home / Self Care | Payer: PRIVATE HEALTH INSURANCE | Attending: Emergency Medicine | Admitting: Emergency Medicine

## 2024-02-09 ENCOUNTER — Encounter (HOSPITAL_COMMUNITY): Payer: Self-pay

## 2024-02-09 ENCOUNTER — Other Ambulatory Visit: Payer: Self-pay

## 2024-02-09 ENCOUNTER — Emergency Department (HOSPITAL_COMMUNITY): Payer: PRIVATE HEALTH INSURANCE

## 2024-02-09 DIAGNOSIS — D72829 Elevated white blood cell count, unspecified: Secondary | ICD-10-CM | POA: Diagnosis not present

## 2024-02-09 DIAGNOSIS — N132 Hydronephrosis with renal and ureteral calculous obstruction: Secondary | ICD-10-CM | POA: Diagnosis not present

## 2024-02-09 DIAGNOSIS — R10A1 Flank pain, right side: Secondary | ICD-10-CM | POA: Diagnosis present

## 2024-02-09 DIAGNOSIS — J45909 Unspecified asthma, uncomplicated: Secondary | ICD-10-CM | POA: Insufficient documentation

## 2024-02-09 DIAGNOSIS — N179 Acute kidney failure, unspecified: Secondary | ICD-10-CM | POA: Insufficient documentation

## 2024-02-09 DIAGNOSIS — N201 Calculus of ureter: Secondary | ICD-10-CM

## 2024-02-09 LAB — CBC WITH DIFFERENTIAL/PLATELET
Abs Immature Granulocytes: 0.09 K/uL — ABNORMAL HIGH (ref 0.00–0.07)
Basophils Absolute: 0.1 K/uL (ref 0.0–0.1)
Basophils Relative: 1 %
Eosinophils Absolute: 0.1 K/uL (ref 0.0–0.5)
Eosinophils Relative: 1 %
HCT: 41.3 % (ref 39.0–52.0)
Hemoglobin: 13.8 g/dL (ref 13.0–17.0)
Immature Granulocytes: 1 %
Lymphocytes Relative: 10 %
Lymphs Abs: 1.5 K/uL (ref 0.7–4.0)
MCH: 31.5 pg (ref 26.0–34.0)
MCHC: 33.4 g/dL (ref 30.0–36.0)
MCV: 94.3 fL (ref 80.0–100.0)
Monocytes Absolute: 0.8 K/uL (ref 0.1–1.0)
Monocytes Relative: 6 %
Neutro Abs: 11.7 K/uL — ABNORMAL HIGH (ref 1.7–7.7)
Neutrophils Relative %: 81 %
Platelets: 385 K/uL (ref 150–400)
RBC: 4.38 MIL/uL (ref 4.22–5.81)
RDW: 13.2 % (ref 11.5–15.5)
WBC: 14.3 K/uL — ABNORMAL HIGH (ref 4.0–10.5)
nRBC: 0 % (ref 0.0–0.2)

## 2024-02-09 LAB — COMPREHENSIVE METABOLIC PANEL WITH GFR
ALT: 13 U/L (ref 0–44)
AST: 18 U/L (ref 15–41)
Albumin: 4.6 g/dL (ref 3.5–5.0)
Alkaline Phosphatase: 94 U/L (ref 38–126)
Anion gap: 17 — ABNORMAL HIGH (ref 5–15)
BUN: 22 mg/dL — ABNORMAL HIGH (ref 6–20)
CO2: 26 mmol/L (ref 22–32)
Calcium: 10 mg/dL (ref 8.9–10.3)
Chloride: 100 mmol/L (ref 98–111)
Creatinine, Ser: 1.43 mg/dL — ABNORMAL HIGH (ref 0.61–1.24)
GFR, Estimated: >60 mL/min (ref >60)
Glucose, Bld: 118 mg/dL — ABNORMAL HIGH (ref 70–99)
Potassium: 3.8 mmol/L (ref 3.5–5.1)
Sodium: 142 mmol/L (ref 135–145)
Total Bilirubin: 0.3 mg/dL (ref 0.0–1.2)
Total Protein: 7.8 g/dL (ref 6.5–8.1)

## 2024-02-09 LAB — URINALYSIS, ROUTINE W REFLEX MICROSCOPIC
Glucose, UA: 100 mg/dL — AB
Ketones, ur: NEGATIVE mg/dL
Leukocytes,Ua: NEGATIVE
Nitrite: NEGATIVE
Protein, ur: 100 mg/dL — AB
Specific Gravity, Urine: >1.03 — ABNORMAL HIGH (ref 1.005–1.030)
pH: 5.5 (ref 5.0–8.0)

## 2024-02-09 LAB — URINALYSIS, MICROSCOPIC (REFLEX): Squamous Epithelial / HPF: NONE SEEN /HPF (ref 0–5)

## 2024-02-09 LAB — LIPASE, BLOOD: Lipase: 64 U/L — ABNORMAL HIGH (ref 11–51)

## 2024-02-09 MED ORDER — ONDANSETRON HCL 4 MG/2ML IJ SOLN
4.0000 mg | Freq: Once | INTRAMUSCULAR | Status: AC
  Administered 2024-02-09: 4 mg via INTRAVENOUS
  Filled 2024-02-09: qty 2

## 2024-02-09 MED ORDER — ONDANSETRON 4 MG PO TBDP: 4.0000 mg | ORAL_TABLET | Freq: Three times a day (TID) | ORAL | 0 refills | Status: AC | PRN

## 2024-02-09 MED ORDER — HYDROMORPHONE HCL 1 MG/ML IJ SOLN
0.5000 mg | Freq: Once | INTRAMUSCULAR | Status: AC
  Administered 2024-02-09: 0.5 mg via INTRAVENOUS
  Filled 2024-02-09: qty 0.5

## 2024-02-09 MED ORDER — LACTATED RINGERS IV BOLUS
1000.0000 mL | Freq: Once | INTRAVENOUS | Status: AC
  Administered 2024-02-09: 1000 mL via INTRAVENOUS

## 2024-02-09 MED ORDER — OXYCODONE HCL 5 MG PO TABS: 5.0000 mg | ORAL_TABLET | Freq: Four times a day (QID) | ORAL | 0 refills | Status: AC | PRN

## 2024-02-09 MED ORDER — KETOROLAC TROMETHAMINE 15 MG/ML IJ SOLN
15.0000 mg | Freq: Once | INTRAMUSCULAR | Status: AC
  Administered 2024-02-09: 15 mg via INTRAVENOUS
  Filled 2024-02-09: qty 1

## 2024-02-09 MED ORDER — KETOROLAC TROMETHAMINE 10 MG PO TABS: 10.0000 mg | ORAL_TABLET | Freq: Four times a day (QID) | ORAL | 0 refills | Status: AC | PRN

## 2024-02-09 MED ORDER — TAMSULOSIN HCL 0.4 MG PO CAPS: 0.4000 mg | ORAL_CAPSULE | Freq: Every day | ORAL | 0 refills | Status: AC

## 2024-02-09 NOTE — Discharge Instructions (Addendum)
 Thank you for coming to [facility] Emergency Department. You have a kidney stone.   We have prescribed: -Zofran  4 mg under the tongue every 6-8 hours as needed for nausea/vomiting -Toradol  10 mg every 6 hours for pain. Please do not take other NSAIDs including advil, ibuprofen, or naproxen while taking this medication. -Tamsulosin  0.4 mg every evening to help the stone pass -Oxycodone  5 mg every 4-6 hours as needed for severe pain. If you are taking the oxycodone , please do not drive while taking this medication. Please take 1-2 capfuls of miralax daily to avoid constipation. -You can also take tylenol  1,000 mg every 8 hours for pain. Heat packs can also help.  Please call Dr. Little office tomorrow morning to make a follow-up appointment for within the week.  Do not hesitate to return to the ED or call 911 if you experience: -Worsening symptoms -Lightheadedness, passing out -Fevers/chills -Anything else that concerns you

## 2024-02-09 NOTE — ED Provider Notes (Signed)
 [place] [facility] DEPARTMENT AT [facility] Provider Note   CSN: [id number] Arrival date & time: 02/09/24  1931     History  Chief Complaint  Patient presents with   Flank Pain    Tyrone Ferguson is a 43 y.o. male with PMH as listed below who presents with R flank pain and suprapubic abdominal pain. Pt reports hx of kidney stones and is having right sided flank pain x 2-3 weeks, similar to pain with previous kidney stones. Currently pain rated 7/10. Today started having trouble urinating well and so came to ED. Denies fevers/chills. +nausea/vomiting. Denies dysuria/hematuria. Has had to have shockwave lithotripsy x 3 and a ureteral stent x1 in the past. Has seen Dr. Sherrilee w/ urology.    Past Medical History:  Diagnosis Date   Asthma    Renal disorder    kidney stones       Home Medications Prior to Admission medications   Medication Sig Start Date End Date Taking? Authorizing Provider  ketorolac  (TORADOL ) 10 MG tablet Take 1 tablet (10 mg total) by mouth every 6 (six) hours as needed. 02/09/24  Yes Franklyn Sid SAILOR, MD  ondansetron  (ZOFRAN -ODT) 4 MG disintegrating tablet Take 1 tablet (4 mg total) by mouth every 8 (eight) hours as needed. 02/09/24  Yes Franklyn Sid SAILOR, MD  oxyCODONE  (ROXICODONE ) 5 MG immediate release tablet Take 1 tablet (5 mg total) by mouth every 6 (six) hours as needed for up to 18 doses for severe pain (pain score 7-10). 02/09/24  Yes Franklyn Sid SAILOR, MD  tamsulosin  (FLOMAX ) 0.4 MG CAPS capsule Take 1 capsule (0.4 mg total) by mouth daily after supper. 02/09/24 03/10/24 Yes Franklyn Sid SAILOR, MD      Allergies    Aspirin and Morphine and codeine    Review of Systems   Review of Systems A 10 point review of systems was performed and is negative unless otherwise reported in HPI.  Physical Exam Updated Vital Signs BP 109/74   Pulse 62   Temp 98.7 F (37.1 C) (Oral)   Resp 18   Wt 99.8 kg   SpO2 98%   BMI 34.46 kg/m  Physical Exam General:  Uncomfortable appearing male, lying in bed.  HEENT: Sclera anicteric, MMM, trachea midline.  Cardiology: RRR, no murmurs/rubs/gallops. Resp: Normal respiratory rate and effort. CTAB, no wheezes, rhonchi, crackles.  Abd: Soft, +RLQ and suprapubic TTP, non-distended. No rebound tenderness or guarding.  GU: Deferred. MSK: No peripheral edema or signs of trauma. Extremities without deformity or TTP.  Skin: warm, dry.  Back: +R CVA tenderness Neuro: A&Ox4, CNs II-XII grossly intact. MAEs. Sensation grossly intact.  Psych: Normal mood and affect.   ED Results / Procedures / Treatments   Labs (all labs ordered are listed, but only abnormal results are displayed) Labs Reviewed  CBC WITH DIFFERENTIAL/PLATELET - Abnormal; Notable for the following components:      Result Value   WBC 14.3 (*)    Neutro Abs 11.7 (*)    Abs Immature Granulocytes 0.09 (*)    All other components within normal limits  COMPREHENSIVE METABOLIC PANEL WITH GFR - Abnormal; Notable for the following components:   Glucose, Bld 118 (*)    BUN 22 (*)    Creatinine, Ser 1.43 (*)    Anion gap 17 (*)    All other components within normal limits  LIPASE, BLOOD - Abnormal; Notable for the following components:   Lipase 64 (*)    All other components within  normal limits  URINALYSIS, ROUTINE W REFLEX MICROSCOPIC - Abnormal; Notable for the following components:   APPearance CLOUDY (*)    Specific Gravity, Urine >1.030 (*)    Glucose, UA 100 (*)    Hgb urine dipstick LARGE (*)    Bilirubin Urine SMALL (*)    Protein, ur 100 (*)    All other components within normal limits  URINALYSIS, MICROSCOPIC (REFLEX) - Abnormal; Notable for the following components:   Bacteria, UA FEW (*)    All other components within normal limits    EKG None  Radiology CT Renal Stone Study Result Date: 02/09/2024 CLINICAL DATA:  Abdominal and flank pain with stone suspected. Previous history of kidney stones. Right flank pain. EXAM: CT  ABDOMEN AND PELVIS WITHOUT CONTRAST TECHNIQUE: Multidetector CT imaging of the abdomen and pelvis was performed following the standard protocol without IV contrast. RADIATION DOSE REDUCTION: This exam was performed according to the departmental dose-optimization program which includes automated exposure control, adjustment of the mA and/or kV according to patient size and/or use of iterative reconstruction technique. COMPARISON:  06/23/2021 FINDINGS: Lower chest: Lung bases are clear. Hepatobiliary: No focal liver abnormality is seen. No gallstones, gallbladder wall thickening, or biliary dilatation. Pancreas: Unremarkable. No pancreatic ductal dilatation or surrounding inflammatory changes. Spleen: Normal in size without focal abnormality. Adrenals/Urinary Tract: No adrenal gland nodules. Bilateral intrarenal stones, largest measuring up to 3 mm diameter. Right-sided hydronephrosis and hydroureter down to the ureterovesical junction where there is a 7 mm stone present. Bladder is decompressed but appears normal. No hydronephrosis or hydroureter on the left. Stomach/Bowel: Stomach is within normal limits. Appendix appears normal. No evidence of bowel wall thickening, distention, or inflammatory changes. Vascular/Lymphatic: Aortic atherosclerosis. No enlarged abdominal or pelvic lymph nodes. Reproductive: Prostate is unremarkable. Other: Minimal periumbilical hernia containing fat. No free air or free fluid in the abdomen. Musculoskeletal: No acute or significant osseous findings. IMPRESSION: 1. 7 mm stone in the distal right ureter with moderate proximal obstruction. 2. Multiple additional nonobstructing intrarenal stones bilaterally. 3. Mild aortic atherosclerosis. 4. Minimal periumbilical hernia containing fat. Electronically Signed   By: Elsie Gravely M.D.   On: 02/09/2024 21:13    Procedures Procedures    Medications Ordered in ED Medications  ondansetron  (ZOFRAN ) injection 4 mg (4 mg Intravenous  Given 02/09/24 2104)  HYDROmorphone  (DILAUDID ) injection 0.5 mg (0.5 mg Intravenous Given 02/09/24 2104)  ketorolac  (TORADOL ) 15 MG/ML injection 15 mg (15 mg Intravenous Given 02/09/24 2142)  lactated ringers bolus 1,000 mL (0 mLs Intravenous Stopped 02/09/24 2239)    ED Course/ Medical Decision Making/ A&P                          Medical Decision Making Amount and/or Complexity of Data Reviewed Labs: ordered. Decision-making details documented in ED Course. Radiology: ordered. Decision-making details documented in ED Course.  Risk Prescription drug management.    This patient presents to the ED for concern of R flank pain w/ nausea/vomiting and h/o kidney stones, this involves an extensive number of treatment options, and is a complaint that carries with it a high risk of complications and morbidity.  I considered the following differential and admission for this acute, potentially life threatening condition. Overall HDS and well-appearing.   MDM:    DDX for flank pain includes but is not limited to:  H/o renal stones and feels like prior renal stones. Indeed, CT renal stone study demonstrates 7mm stone in distal R  ureter with proximal moderate obstruction.  No other abnormalities noted on the imaging.  He does have a mild AKI and is noted to be dehydrated in his urine but reassuringly does not have any UTI.  He is given a liter of fluid, Toradol  IV, Zofran , and Dilaudid  with improvement of his symptoms.  Discussed with Dr. Carolee with urology who recommends that patient call Dr. Little office tomorrow morning to schedule a follow-up appointment.  Patient had stated that he had trouble urinating but on bladder scan only had 7 mm in his bladder and I suspect likely just lower urine volume due to dehydration as opposed to urinary retention.  He is afebrile and otherwise well-appearing after the medication and fluids and is ready to be discharged.  Pain decreased to 3 out of 10.  Have  prescribed Zofran  ODT, Toradol  p.o., tamsulosin , and oxycodone  p.o.  Instructed patient to take Tylenol  and Toradol  prior to taking oxycodone  for pain.  Instructed him to take MiraLAX if he is taking oxycodone  and to avoid driving while taking the medication.  Instructed him to call Dr. Little office in the morning and patient states he will.  Given specific discharge instructions and return precautions including fever.   Clinical Course as of 02/09/24 2307  Tue Feb 09, 2024  2124 WBC(!): 14.3 +leukocytosis w/ left shift [HN]  2125 CT Renal Stone Study 1. 7 mm stone in the distal right ureter with moderate proximal obstruction. 2. Multiple additional nonobstructing intrarenal stones bilaterally. 3. Mild aortic atherosclerosis. 4. Minimal periumbilical hernia containing fat.   [HN]  2142 Creatinine(!): 1.43 +AKI [HN]  2143 Urinalysis, Routine w reflex microscopic -Urine, Clean Catch(!) +hematuria and concentrated/dehydrated bu tno UTI. Giving IVF and will consult w/ urology. [HN]    Clinical Course User Index [HN] Franklyn Sid SAILOR, MD    Labs: I Ordered, and personally interpreted labs.  The pertinent results include:  those listed above  Imaging Studies ordered: I ordered imaging studies including CT renal stone study I independently visualized and interpreted imaging. I agree with the radiologist interpretation  Additional history obtained from chart review.    Cardiac Monitoring: The patient was maintained on a cardiac monitor.  I personally viewed and interpreted the cardiac monitored which showed an underlying rhythm of: sinus bradycardia  Reevaluation: After the interventions noted above, I reevaluated the patient and found that they have :improved  Social Determinants of Health: Lives independently  Disposition:  DC w/ discharge instructions/return precautions. All questions answered to patient's satisfaction.    Co morbidities that complicate the patient  evaluation  Past Medical History:  Diagnosis Date   Asthma    Renal disorder    kidney stones     Medicines Meds ordered this encounter  Medications   ondansetron  (ZOFRAN ) injection 4 mg   HYDROmorphone  (DILAUDID ) injection 0.5 mg   ketorolac  (TORADOL ) 15 MG/ML injection 15 mg   lactated ringers bolus 1,000 mL   ondansetron  (ZOFRAN -ODT) 4 MG disintegrating tablet    Sig: Take 1 tablet (4 mg total) by mouth every 8 (eight) hours as needed.    Dispense:  20 tablet    Refill:  0   ketorolac  (TORADOL ) 10 MG tablet    Sig: Take 1 tablet (10 mg total) by mouth every 6 (six) hours as needed.    Dispense:  20 tablet    Refill:  0   tamsulosin  (FLOMAX ) 0.4 MG CAPS capsule    Sig: Take 1 capsule (0.4 mg total) by mouth  daily after supper.    Dispense:  30 capsule    Refill:  0   oxyCODONE  (ROXICODONE ) 5 MG immediate release tablet    Sig: Take 1 tablet (5 mg total) by mouth every 6 (six) hours as needed for up to 18 doses for severe pain (pain score 7-10).    Dispense:  18 tablet    Refill:  0    I have reviewed the patients home medicines and have made adjustments as needed  Problem List / ED Course: Problem List Items Addressed This Visit   None Visit Diagnoses       Calculus of ureter    -  Primary   Relevant Medications   HYDROmorphone  (DILAUDID ) injection 0.5 mg (Completed)   oxyCODONE  (ROXICODONE ) 5 MG immediate release tablet     AKI (acute kidney injury)                       This note was created using dictation software, which may contain spelling or grammatical errors.    Franklyn Sid SAILOR, MD 02/09/24 [phone]

## 2024-02-09 NOTE — ED Notes (Signed)
 See triage notes. Pt pain started today around 5pm. Pt slightly pale. Has been vomiting, emesis noted in bag. Grimacing noted.

## 2024-02-09 NOTE — ED Notes (Signed)
 Pt does not feel urge to urinate, bladder scanned and only showed 4-25ml

## 2024-02-09 NOTE — ED Triage Notes (Signed)
 Pt reports hx of kidney stones and is having right side flank pain.

## 2024-02-09 NOTE — ED Notes (Signed)
 Pt taken to ct

## 2024-02-09 NOTE — ED Notes (Signed)
 Pt waiting on ride

## 2024-02-09 NOTE — ED Notes (Signed)
 Walked in with toradol . Pt states pain 3, then needed emesis bag. Pt vomited small amount and pain up to 7.
